# Patient Record
Sex: Female | Born: 1975 | Race: White | Hispanic: Yes | Marital: Married | Smoking: Never smoker
Health system: Southern US, Community
[De-identification: ages and names within clinical notes are randomized; demographics above are authoritative.]

## PROBLEM LIST (undated history)

## (undated) DIAGNOSIS — M1990 Unspecified osteoarthritis, unspecified site: Secondary | ICD-10-CM

## (undated) DIAGNOSIS — Z975 Presence of (intrauterine) contraceptive device: Secondary | ICD-10-CM

## (undated) DIAGNOSIS — O99810 Abnormal glucose complicating pregnancy: Secondary | ICD-10-CM

## (undated) DIAGNOSIS — I959 Hypotension, unspecified: Secondary | ICD-10-CM

## (undated) DIAGNOSIS — H269 Unspecified cataract: Secondary | ICD-10-CM

## (undated) HISTORY — DX: Presence of (intrauterine) contraceptive device: Z97.5

## (undated) HISTORY — DX: Unspecified cataract: H26.9

## (undated) HISTORY — DX: Abnormal glucose complicating pregnancy: O99.810

## (undated) HISTORY — DX: Unspecified osteoarthritis, unspecified site: M19.90

---

## 2002-09-16 ENCOUNTER — Encounter: Admission: RE | Admit: 2002-09-16 | Discharge: 2002-09-16 | Payer: Self-pay | Admitting: Family Medicine

## 2002-10-23 ENCOUNTER — Encounter: Admission: RE | Admit: 2002-10-23 | Discharge: 2002-10-23 | Payer: Self-pay | Admitting: Family Medicine

## 2003-01-21 ENCOUNTER — Encounter: Admission: RE | Admit: 2003-01-21 | Discharge: 2003-01-21 | Payer: Self-pay | Admitting: Family Medicine

## 2003-01-22 ENCOUNTER — Encounter: Payer: Self-pay | Admitting: Family Medicine

## 2003-01-22 ENCOUNTER — Ambulatory Visit (HOSPITAL_COMMUNITY): Admission: RE | Admit: 2003-01-22 | Discharge: 2003-01-22 | Payer: Self-pay | Admitting: Family Medicine

## 2003-01-28 ENCOUNTER — Encounter: Admission: RE | Admit: 2003-01-28 | Discharge: 2003-01-28 | Payer: Self-pay | Admitting: Family Medicine

## 2003-03-10 ENCOUNTER — Encounter: Admission: RE | Admit: 2003-03-10 | Discharge: 2003-03-10 | Payer: Self-pay | Admitting: Family Medicine

## 2003-03-26 ENCOUNTER — Encounter: Admission: RE | Admit: 2003-03-26 | Discharge: 2003-03-26 | Payer: Self-pay | Admitting: Sports Medicine

## 2003-04-16 ENCOUNTER — Inpatient Hospital Stay (HOSPITAL_COMMUNITY): Admission: AD | Admit: 2003-04-16 | Discharge: 2003-04-16 | Payer: Self-pay | Admitting: Obstetrics & Gynecology

## 2003-04-16 ENCOUNTER — Encounter: Admission: RE | Admit: 2003-04-16 | Discharge: 2003-04-16 | Payer: Self-pay | Admitting: Family Medicine

## 2003-04-20 ENCOUNTER — Encounter: Admission: RE | Admit: 2003-04-20 | Discharge: 2003-04-20 | Payer: Self-pay | Admitting: Family Medicine

## 2003-04-29 ENCOUNTER — Encounter: Admission: RE | Admit: 2003-04-29 | Discharge: 2003-04-29 | Payer: Self-pay | Admitting: Sports Medicine

## 2003-05-04 ENCOUNTER — Inpatient Hospital Stay (HOSPITAL_COMMUNITY): Admission: AD | Admit: 2003-05-04 | Discharge: 2003-05-05 | Payer: Self-pay | Admitting: Obstetrics & Gynecology

## 2003-05-15 ENCOUNTER — Inpatient Hospital Stay (HOSPITAL_COMMUNITY): Admission: AD | Admit: 2003-05-15 | Discharge: 2003-05-15 | Payer: Self-pay | Admitting: Family Medicine

## 2003-05-21 ENCOUNTER — Encounter: Admission: RE | Admit: 2003-05-21 | Discharge: 2003-05-21 | Payer: Self-pay | Admitting: Family Medicine

## 2003-07-16 ENCOUNTER — Encounter: Admission: RE | Admit: 2003-07-16 | Discharge: 2003-07-16 | Payer: Self-pay | Admitting: Sports Medicine

## 2003-07-20 ENCOUNTER — Encounter: Admission: RE | Admit: 2003-07-20 | Discharge: 2003-07-20 | Payer: Self-pay | Admitting: Family Medicine

## 2003-08-03 ENCOUNTER — Encounter: Admission: RE | Admit: 2003-08-03 | Discharge: 2003-08-03 | Payer: Self-pay | Admitting: Family Medicine

## 2003-08-06 ENCOUNTER — Ambulatory Visit (HOSPITAL_COMMUNITY): Admission: RE | Admit: 2003-08-06 | Discharge: 2003-08-06 | Payer: Self-pay | Admitting: Obstetrics & Gynecology

## 2003-09-16 ENCOUNTER — Encounter: Admission: RE | Admit: 2003-09-16 | Discharge: 2003-09-16 | Payer: Self-pay | Admitting: Sports Medicine

## 2003-12-15 ENCOUNTER — Encounter: Admission: RE | Admit: 2003-12-15 | Discharge: 2003-12-15 | Payer: Self-pay | Admitting: Family Medicine

## 2003-12-17 ENCOUNTER — Encounter: Admission: RE | Admit: 2003-12-17 | Discharge: 2003-12-17 | Payer: Self-pay | Admitting: Family Medicine

## 2003-12-29 ENCOUNTER — Encounter: Admission: RE | Admit: 2003-12-29 | Discharge: 2003-12-29 | Payer: Self-pay | Admitting: Family Medicine

## 2004-01-11 ENCOUNTER — Encounter: Admission: RE | Admit: 2004-01-11 | Discharge: 2004-03-21 | Payer: Self-pay | Admitting: Family Medicine

## 2004-01-26 ENCOUNTER — Encounter: Admission: RE | Admit: 2004-01-26 | Discharge: 2004-01-26 | Payer: Self-pay | Admitting: Sports Medicine

## 2004-03-28 ENCOUNTER — Encounter: Admission: RE | Admit: 2004-03-28 | Discharge: 2004-06-26 | Payer: Self-pay | Admitting: Family Medicine

## 2004-06-24 ENCOUNTER — Emergency Department (HOSPITAL_COMMUNITY): Admission: EM | Admit: 2004-06-24 | Discharge: 2004-06-24 | Payer: Self-pay | Admitting: Emergency Medicine

## 2004-06-30 ENCOUNTER — Ambulatory Visit: Payer: Self-pay | Admitting: Sports Medicine

## 2005-02-21 ENCOUNTER — Encounter (INDEPENDENT_AMBULATORY_CARE_PROVIDER_SITE_OTHER): Payer: Self-pay | Admitting: Family Medicine

## 2005-02-21 ENCOUNTER — Ambulatory Visit: Payer: Self-pay | Admitting: Family Medicine

## 2005-02-21 ENCOUNTER — Encounter (INDEPENDENT_AMBULATORY_CARE_PROVIDER_SITE_OTHER): Payer: Self-pay | Admitting: *Deleted

## 2005-02-21 LAB — CONVERTED CEMR LAB

## 2005-07-18 ENCOUNTER — Ambulatory Visit: Payer: Self-pay | Admitting: Family Medicine

## 2006-05-13 ENCOUNTER — Ambulatory Visit: Payer: Self-pay

## 2006-08-16 ENCOUNTER — Encounter (INDEPENDENT_AMBULATORY_CARE_PROVIDER_SITE_OTHER): Payer: Self-pay | Admitting: *Deleted

## 2006-12-09 ENCOUNTER — Ambulatory Visit: Payer: Self-pay | Admitting: Family Medicine

## 2006-12-09 DIAGNOSIS — M79609 Pain in unspecified limb: Secondary | ICD-10-CM

## 2006-12-09 DIAGNOSIS — M542 Cervicalgia: Secondary | ICD-10-CM | POA: Insufficient documentation

## 2007-07-29 ENCOUNTER — Ambulatory Visit: Payer: Self-pay | Admitting: Family Medicine

## 2007-08-28 ENCOUNTER — Encounter: Payer: Self-pay | Admitting: *Deleted

## 2007-09-22 ENCOUNTER — Encounter (INDEPENDENT_AMBULATORY_CARE_PROVIDER_SITE_OTHER): Payer: Self-pay | Admitting: Family Medicine

## 2007-09-22 ENCOUNTER — Ambulatory Visit: Payer: Self-pay | Admitting: Family Medicine

## 2007-09-24 LAB — CONVERTED CEMR LAB: Pap Smear: NORMAL

## 2007-09-25 ENCOUNTER — Encounter: Admission: RE | Admit: 2007-09-25 | Discharge: 2007-09-25 | Payer: Self-pay | Admitting: Family Medicine

## 2008-06-22 ENCOUNTER — Ambulatory Visit: Payer: Self-pay | Admitting: Family Medicine

## 2008-06-22 ENCOUNTER — Encounter: Payer: Self-pay | Admitting: Family Medicine

## 2008-06-22 ENCOUNTER — Emergency Department (HOSPITAL_COMMUNITY): Admission: EM | Admit: 2008-06-22 | Discharge: 2008-06-22 | Payer: Self-pay | Admitting: Emergency Medicine

## 2008-06-22 LAB — CONVERTED CEMR LAB
Beta hcg, urine, semiquantitative: NEGATIVE
Bilirubin Urine: NEGATIVE
Chlamydia, DNA Probe: NEGATIVE
GC Probe Amp, Genital: NEGATIVE
Glucose, Urine, Semiquant: NEGATIVE
Ketones, urine, test strip: NEGATIVE
Nitrite: NEGATIVE
Protein, U semiquant: NEGATIVE
Specific Gravity, Urine: 1.01
Urobilinogen, UA: 0.2
WBC Urine, dipstick: NEGATIVE
Whiff Test: NEGATIVE
pH: 5.5

## 2008-11-02 ENCOUNTER — Ambulatory Visit: Payer: Self-pay | Admitting: Family Medicine

## 2009-05-02 ENCOUNTER — Ambulatory Visit: Payer: Self-pay | Admitting: Family Medicine

## 2009-07-27 ENCOUNTER — Ambulatory Visit: Payer: Self-pay | Admitting: Family Medicine

## 2009-09-14 ENCOUNTER — Ambulatory Visit: Payer: Self-pay | Admitting: Family Medicine

## 2009-09-14 ENCOUNTER — Encounter: Payer: Self-pay | Admitting: Family Medicine

## 2009-09-14 LAB — CONVERTED CEMR LAB
Beta hcg, urine, semiquantitative: POSITIVE
Bilirubin Urine: NEGATIVE
Chlamydia, DNA Probe: NEGATIVE
GC Probe Amp, Genital: NEGATIVE
Glucose, Urine, Semiquant: NEGATIVE
Ketones, urine, test strip: NEGATIVE
Nitrite: NEGATIVE
Pap Smear: NEGATIVE
Protein, U semiquant: NEGATIVE
Specific Gravity, Urine: 1.015
Urobilinogen, UA: 1
WBC Urine, dipstick: NEGATIVE
Whiff Test: NEGATIVE
pH: 7.5

## 2009-09-29 ENCOUNTER — Ambulatory Visit: Payer: Self-pay | Admitting: Family Medicine

## 2009-09-29 ENCOUNTER — Encounter: Payer: Self-pay | Admitting: Family Medicine

## 2009-09-29 LAB — CONVERTED CEMR LAB
Antibody Screen: NEGATIVE
Basophils Absolute: 0 10*3/uL (ref 0.0–0.1)
Basophils Relative: 0 % (ref 0–1)
Eosinophils Absolute: 0 10*3/uL (ref 0.0–0.7)
Eosinophils Relative: 1 % (ref 0–5)
HCT: 39 % (ref 36.0–46.0)
Hemoglobin: 12.9 g/dL (ref 12.0–15.0)
Hepatitis B Surface Ag: NEGATIVE
Lymphocytes Relative: 29 % (ref 12–46)
Lymphs Abs: 1.9 10*3/uL (ref 0.7–4.0)
MCHC: 33.1 g/dL (ref 30.0–36.0)
MCV: 84.6 fL (ref 78.0–100.0)
Monocytes Absolute: 0.4 10*3/uL (ref 0.1–1.0)
Monocytes Relative: 6 % (ref 3–12)
Neutro Abs: 4.2 10*3/uL (ref 1.7–7.7)
Neutrophils Relative %: 64 % (ref 43–77)
Platelets: 279 10*3/uL (ref 150–400)
RBC: 4.61 M/uL (ref 3.87–5.11)
RDW: 13 % (ref 11.5–15.5)
Rh Type: POSITIVE
Rubella: 136.7 intl units/mL — ABNORMAL HIGH
Sickle Cell Screen: NEGATIVE
WBC: 6.5 10*3/uL (ref 4.0–10.5)

## 2009-09-30 ENCOUNTER — Encounter: Payer: Self-pay | Admitting: Family Medicine

## 2009-10-24 ENCOUNTER — Ambulatory Visit: Payer: Self-pay | Admitting: Family Medicine

## 2009-10-24 ENCOUNTER — Encounter: Payer: Self-pay | Admitting: Family Medicine

## 2009-10-24 LAB — CONVERTED CEMR LAB

## 2009-10-25 ENCOUNTER — Encounter: Payer: Self-pay | Admitting: Family Medicine

## 2009-10-25 ENCOUNTER — Ambulatory Visit (HOSPITAL_COMMUNITY): Admission: RE | Admit: 2009-10-25 | Discharge: 2009-10-25 | Payer: Self-pay | Admitting: Family Medicine

## 2009-10-26 ENCOUNTER — Encounter: Payer: Self-pay | Admitting: Family Medicine

## 2009-10-26 ENCOUNTER — Ambulatory Visit: Payer: Self-pay | Admitting: Family Medicine

## 2009-10-26 DIAGNOSIS — O99810 Abnormal glucose complicating pregnancy: Secondary | ICD-10-CM | POA: Insufficient documentation

## 2009-10-26 HISTORY — DX: Abnormal glucose complicating pregnancy: O99.810

## 2009-11-21 ENCOUNTER — Ambulatory Visit: Payer: Self-pay | Admitting: Family Medicine

## 2009-11-23 ENCOUNTER — Encounter: Payer: Self-pay | Admitting: Family Medicine

## 2009-12-12 ENCOUNTER — Encounter: Payer: Self-pay | Admitting: Family Medicine

## 2009-12-12 ENCOUNTER — Ambulatory Visit (HOSPITAL_COMMUNITY): Admission: RE | Admit: 2009-12-12 | Discharge: 2009-12-12 | Payer: Self-pay | Admitting: Family Medicine

## 2009-12-16 ENCOUNTER — Ambulatory Visit: Payer: Self-pay | Admitting: Family Medicine

## 2010-01-13 ENCOUNTER — Ambulatory Visit: Payer: Self-pay | Admitting: Family Medicine

## 2010-02-10 ENCOUNTER — Ambulatory Visit: Payer: Self-pay | Admitting: Family Medicine

## 2010-02-15 ENCOUNTER — Ambulatory Visit: Payer: Self-pay | Admitting: Family Medicine

## 2010-02-15 ENCOUNTER — Encounter: Payer: Self-pay | Admitting: Family Medicine

## 2010-02-17 ENCOUNTER — Ambulatory Visit (HOSPITAL_COMMUNITY): Admission: RE | Admit: 2010-02-17 | Discharge: 2010-02-17 | Payer: Self-pay | Admitting: Family Medicine

## 2010-02-17 ENCOUNTER — Encounter: Payer: Self-pay | Admitting: Family Medicine

## 2010-02-23 ENCOUNTER — Ambulatory Visit: Payer: Self-pay | Admitting: Family Medicine

## 2010-02-23 LAB — CONVERTED CEMR LAB
HCT: 36.2 % (ref 36.0–46.0)
Hemoglobin: 11.9 g/dL — ABNORMAL LOW (ref 12.0–15.0)
MCHC: 32.9 g/dL (ref 30.0–36.0)
MCV: 88.1 fL (ref 78.0–100.0)
Platelets: 242 10*3/uL (ref 150–400)
RBC: 4.11 M/uL (ref 3.87–5.11)
RDW: 13.2 % (ref 11.5–15.5)
WBC: 8.7 10*3/uL (ref 4.0–10.5)

## 2010-03-09 ENCOUNTER — Ambulatory Visit: Payer: Self-pay | Admitting: Family Medicine

## 2010-03-24 ENCOUNTER — Ambulatory Visit: Payer: Self-pay | Admitting: Family Medicine

## 2010-04-11 ENCOUNTER — Ambulatory Visit: Payer: Self-pay | Admitting: Family Medicine

## 2010-04-20 ENCOUNTER — Encounter: Payer: Self-pay | Admitting: Family Medicine

## 2010-04-20 ENCOUNTER — Ambulatory Visit: Payer: Self-pay | Admitting: Family Medicine

## 2010-04-24 ENCOUNTER — Ambulatory Visit: Payer: Self-pay | Admitting: Family Medicine

## 2010-05-03 ENCOUNTER — Ambulatory Visit: Payer: Self-pay | Admitting: Family Medicine

## 2010-05-04 ENCOUNTER — Encounter: Payer: Self-pay | Admitting: *Deleted

## 2010-05-08 ENCOUNTER — Inpatient Hospital Stay (HOSPITAL_COMMUNITY)
Admission: AD | Admit: 2010-05-08 | Discharge: 2010-05-08 | Payer: Self-pay | Source: Home / Self Care | Admitting: Obstetrics & Gynecology

## 2010-05-08 ENCOUNTER — Inpatient Hospital Stay (HOSPITAL_COMMUNITY)
Admission: AD | Admit: 2010-05-08 | Discharge: 2010-05-10 | Payer: Self-pay | Source: Home / Self Care | Admitting: Obstetrics & Gynecology

## 2010-05-08 ENCOUNTER — Ambulatory Visit: Payer: Self-pay | Admitting: Family Medicine

## 2010-06-23 ENCOUNTER — Ambulatory Visit: Admit: 2010-06-23 | Payer: Self-pay

## 2010-07-06 ENCOUNTER — Ambulatory Visit: Admission: RE | Admit: 2010-07-06 | Discharge: 2010-07-06 | Payer: Self-pay | Source: Home / Self Care

## 2010-07-06 DIAGNOSIS — R519 Headache, unspecified: Secondary | ICD-10-CM | POA: Insufficient documentation

## 2010-07-06 DIAGNOSIS — R51 Headache: Secondary | ICD-10-CM | POA: Insufficient documentation

## 2010-07-06 DIAGNOSIS — K219 Gastro-esophageal reflux disease without esophagitis: Secondary | ICD-10-CM | POA: Insufficient documentation

## 2010-07-18 NOTE — Assessment & Plan Note (Signed)
Summary: OB/MJ   Vital Signs:  Patient profile:   35 year old female Weight:      160.5 pounds BP sitting:   102 / 65  Vitals Entered By: Arlyss Repress CMA, (April 20, 2010 9:15 AM)  Primary Care [place]:  Angelena Sole MD   History of Present Illness: Visit in OB clinic, conducted in Spanish.  At 36 6/7 weeks by early [name].  Chart reviewed.    Patient's only complaint is hemorrhoidal pain that has increased over the past 1-2 weeks.  Denies bleeding with defecation.  Previously used Anusol HC cream with good results.  Increased fluid intake.   Habits & Providers  Alcohol-Tobacco-Diet     Cigarette Packs/Day: n/a  Current Medications (verified): 1)  Pnv-Dha 27-0.6-0.4-300 Mg Caps (Prenat W/o A-Fe-Methfol-Fa-Dha) .Marland Kitchen.. 1 Tab By Mouth Daily 2)  Anusol-Hc 2.5 % Crea (Hydrocortisone) .... Sig: Apply Twice Daily To Perianal Area. Disp 1 Large Tube Spanish Language Instructions  Allergies (verified): No Known Drug Allergies  Physical Exam  General:  Well appearing, no apparent distress.  Abdomen:  Gravid, fetal lie VTX L sided. Rectal:  small nonthrombosed hemorroid seen at 6 o'clock.  Small shallow fissure at 12 o'clock.  No internal hemorroids noted in DRE. Good rectal tone.  Genitalia:  Normal vaginal mucosa.  GBS swab collected.  Extremities:  trace ankle edema to palpation.   Impression & Recommendations:  Problem # 1:  PREGNANCY, MULTIGRAVIDA (ICD-V22.1) Patient seen in OB clinic, visit conducted in [place].  Feeling good fetal movement.  No visual changes or headaches.  Complains of perianal discomfort that is worse when she has a bowel movement.  Similar to previous hemorrhoids prior to pregnancy.  No blood in stool.  Increased fluid intake.  At 36 weeks 6 days today by [name] at 10/25/2009; prior low-lying placenta resolved on subsequent [name].   Plans to breastfeed, to bring her infant to [facility] for routine care.  Orders: [facility]p B Probe-[facility] ([id number]) Other OB visit- [facility]  (OBCK)  Problem # 2:  HEMORRHOIDS, EXTERNAL (ICD-455.3) Ext hemorrhoid and small midline fissure.  Not thrombosed.  Discussed increased fluid intake; use of topical cream.  Printed Rx given to patient.  Complete Medication List: 1)  Pnv-dha 27-0.6-0.4-300 Mg Caps (Prenat w/o a-fe-methfol-fa-dha) .Marland Kitchen.. 1 tab by mouth daily 2)  Anusol-hc 2.5 % Crea (Hydrocortisone) .... Sig: apply twice daily to perianal area. disp 1 large tube spanish language instructions  Patient Instructions: 1)  Fue un placer verle hoy en la clinica de obstetricia.  Tiene 36 semanas con 6 dias; el crecimiento del bebe esta' muy apropiado. 2)  Hoy le hicimos un cultivo para una bacteria que puede representar un peligro para el bebe (un tipo de Warden/ranger).  Si esta' presente, se puede tratar en la hora del parto con una clase de penicilina.  3)  Cada manana y noche enfoquese en los movimientos del bebe.  Si siente por lo  menos cinco movimientos en una hora, no tiene que seguir contando.  Si no llega a cinco movimientos en una hora, siga contando por otra hora.  Si no llega a diez movimientos en estas dos horas, debe presentarse al [employer] de Emergencia de [facility] o llamar a Event organiser. 4)  Por favor marque una cita en una semana con el Dr Gwendolyn Grant.  5)  OB FOLLOWUP WITH DR Gwendolyn Grant IN 1 WEEK. Prescriptions: ANUSOL-HC 2.5 % CREA (HYDROCORTISONE) SIG: Apply twice daily to perianal area. DISP 1 large tube SPanish language instructions  #  1 x 1   Entered and Authorized by:   Paula Compton MD   Signed by:   Paula Compton MD on 04/20/2010   Method used:   Print then Give to Patient   RxID:   [id number]    Orders Added: 1)  Grp B Probe-[facility] [[id number]] 2)  Other OB visit- [facility] [OBCK]     OB Initial Intake Information    Positive HCG by: self    Race: Other    Marital status: Married  FOB Information    Husband/Father of baby: Husband    FOB occupation [employer]  Menstrual History    LMP (date):  07/14/2009    LMP - Character: normal    Menarche: 15 years    Menses interval: 28 days    Menstrual flow 5 days    On BCP's at conception: no   Flowsheet View for Follow-up Visit    Estimated weeks of       gestation:     36 6/7    Weight:     160.5    Blood pressure:   102 / 65    Headache:     No    Nausea/vomiting:   No    Edema:     TrLE    Vaginal bleeding:   no    Vaginal discharge:   no    Fundal height:      35.5    FHR:       140    Fetal activity:     yes    Labor symptoms:   few ctx    Fetal position:     vertex    Taking prenatal vits?   Y    Smoking:     n/a    Next visit:     1 wk    Preceptor:     Mauricio Po    Comment:     GBS done today.    Flowsheet View for Follow-up Visit    Estimated weeks of       gestation:     36 6/7    Weight:     160.5    Blood pressure:   102 / 65    Hx headache?     No    Nausea/vomiting?   No    Edema?     TrLE    Bleeding?     no    Leakage/discharge?   no    Fetal activity:       yes    Labor symptoms?   few ctx    Fundal height:      35.5    FHR:       140    Fetal position:      vertex    Taking Vitamins?   Y    Smoking PPD:   n/a    Comment:     GBS done today.     Next visit:     1 wk    Preceptor:     Mauricio Po

## 2010-07-18 NOTE — Assessment & Plan Note (Signed)
Summary: ob visit/eo   Vital Signs:  Patient profile:   35 year old female Weight:      158.9 pounds Temp:     98.7 degrees F oral Pulse rate:   88 / minute Pulse rhythm:   regular BP sitting:   98 / 60  (left arm) Cuff size:   regular  Vitals Entered By: Loralee Pacas CMA (April 11, 2010 8:34 AM)  Primary Care Provider:  Angelena Sole MD   History of Present Illness: 35 yo G3P2002 who presents today for follow-up routine OB visit.  No problems thus far with pregnancy.  She is switching OB providers.    Only complaint is some back pain she's had for past week or so, mild, lumbar region, relieved with occasional Tylenol use.   Habits & Providers  Alcohol-Tobacco-Diet     Cigarette Packs/Day: n/a  Current Problems (verified): 1)  Abnormal Maternal Glucose Tolerance Antepartum  (MWN-027.25) 2)  Pregnancy, Multigravida  (ICD-V22.1) 3)  Family History of Malignant Neoplasm Ovary  (ICD-V16.41) 4)  Screening For Malignant Neoplasm of The Cervix  (ICD-V76.2) 5)  Routine Gynecological Examination  (ICD-V72.31) 6)  Leg Pain, Right  (ICD-729.5) 7)  Neck Pain  (ICD-723.1)  Current Medications (verified): 1)  Pnv-Dha 27-0.6-0.4-300 Mg Caps (Prenat W/o A-Fe-Methfol-Fa-Dha) .Marland Kitchen.. 1 Tab By Mouth Daily  Allergies (verified): No Known Drug Allergies  Past History:  Past medical, surgical, family and social histories (including risk factors) reviewed, and no changes noted (except as noted below).  Past Medical History: Reviewed history from 12/16/2009 and no changes required. 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 stenting due to nephrolithiasis? - 06/18/2000    Family History: Reviewed history from 07/27/2009 and no changes required. ? Stomach cancer, + DM, mother & MGM, F-died from accident, M-died at age 71, ovarian CA, No Breast Cancer, No CAD, No Colon  Cancer, No HTN    Social History: Reviewed history from 02/10/2010 and no changes required. living with husbanb, daughter and son in [place]; originally from [place], [place]; no tobacco/ETOH/ellicit drugs; was working in [employer]; however, is no longer working there until she delivers  Review of Systems       no headaches, vision changes, chest pain, dyspnea, nausea/vomiting, changes in bowel habits, lower extremity edema   Physical Exam  General:  Vital signs reviewed. Well-developed, well-nourished patient in NAD.  Awake and cooperative  Mouth:  oral dentition fair Lungs:  clear to auscultation bilaterally without wheezing, rales, or rhonchi.  Normal work of breathing  Heart:  Regular rate and rhythm without murmur, rub, or gallop.  Normal S1/S2  Abdomen:  gravid, fundal height 34 cm.  FHTs appropriate for gestational age, bowel sounds present in all four quadrants    Impression & Recommendations:  Problem # 1:  PREGNANCY, MULTIGRAVIDA (ICD-V22.1) Assessment Unchanged  34 yo [id number] at 35.4 weeks via 1st trimester [name].   EDC: 05/12/10 4/14 Prenatal labs: Hgb 12.9/Hct 39, Platelets 279, Hep B Neg, Rubella Imm, RPR NR, O+/Ab neg, [place] neg, HIV NR 4/14 Urine culture:  Insignificant growth 5/9 1 hour Glucola: 148 5/9 Varicella titer IgG Negative 5/10 [name]:  Single IUP gestational age 63.4 weeks, complete previa 5/11 3 hour Glucola: [phone] 8/31 28 week 3 hour: [phone] 9/2 [name]: previa resolved.   9/8: 28 week labs: Hgb 11.9/Hct 36.2, HIV NR, RPR NR  My first time meeting the patient today.  No problems or complaints with pregnancy thus far.  Fundal heigh 34 cm, has been 1 cm smaller than expected last measurement.  Otherwise FHT's have been WNL.  Will schedule for OB Clinic next available appt.  She is to follow up with me in 1 week, will obtain GBS at that visit.    Orders: Other OB visit- [facility] (OBCK)  Complete Medication List: 1)  Pnv-dha  27-0.6-0.4-300 Mg Caps (Prenat w/o a-fe-methfol-fa-dha) .Marland Kitchen.. 1 tab by mouth daily  Patient Instructions: 1)  You are doing great. 2)  It was great to meet you today! 3)  Make an appt to see me next week, and for the next OB visit.  4)  If you notice any decreased movement, vaginal bleeding, or discharge please go to the [facility]   Orders Added: 1)  Other OB visit- [facility] [OBCK]      Flowsheet View for Follow-up Visit    Estimated weeks of       gestation:     35 4/7    Weight:     158.9    Blood pressure:   98 / 60    Headache:     No    Nausea/vomiting:   No    Edema:     0    Vaginal bleeding:   no    Vaginal discharge:   no    Fundal height:      34    FHR:       130    Fetal activity:     yes    Taking prenatal vits?   Y    Smoking:     n/a    Next visit:     1 wk    Resident:     Buren Kos for Follow-up Visit    Estimated weeks of       gestation:     35 4/7    Weight:     158.9    Blood pressure:   98 / 60    Hx headache?     No    Nausea/vomiting?   No    Edema?     0    Bleeding?     no    Leakage/discharge?   no    Fetal activity:       yes    Fundal height:      34    FHR:       130    Taking Vitamins?   Y    Smoking PPD:   n/a    Next visit:     1 wk    Resident:     Lelon Perla   OB Initial Intake Information    Positive HCG by: self    Race: Other    Marital status: Married  FOB Information    Husband/Father of baby: Husband    FOB occupation [employer]  Menstrual History    LMP (date): 07/14/2009    LMP - Character: normal    Menarche: 15 years    Menses interval: 28 days    Menstrual flow 5 days    On BCP's at conception: no   Past Pregnancy History  Pregnancy # 1    Comments:     Outcome: Miscarriage.

## 2010-07-18 NOTE — Assessment & Plan Note (Signed)
Summary: new ob/aam/kh   Vital Signs:  Patient profile:   35 year old female LMP:     07/14/2009 Height:      62 inches Weight:      136.9 pounds BMI:     25.13 Pulse rate:   70 / minute BP sitting:   93 / 56  (right arm) Cuff size:   regular  Vitals Entered By: Garen Grams LPN (Oct 25, 979 8:51 AM) CC: NOB Is Patient Diabetic? No Pain Assessment Patient in pain? no      LMP (date): 07/14/2009 EDC by LMP==> 04/20/2010 LMP - Character: normal LMP - Reliable? Yes Menarche (age onset years): 15   Menses interval (days): 28 Menstrual flow (days): 5 On BCP's at conception: no Enter LMP: 07/14/2009 Last PAP Result NEGATIVE FOR INTRAEPITHELIAL LESIONS OR MALIGNANCY.   OB Initial Intake Information    Positive HCG by: self    Race: Other    Marital status: Married  FOB Information    Husband/Father of baby: Husband    FOB occupation [employer]  Menstrual History    LMP (date): 07/14/2009    EDC by LMP: 04/20/2010    LMP - Character: normal    LMP - Reliable? : Yes    Menarche: 15 years    Menses interval: 28 days    Menstrual flow 5 days    On BCP's at conception: no    Pre Pregnancy Weight: 135 lbs.    Symptoms since LMP: nausea, fatigue    Other symptoms: headache   CC:  NOB.  Habits & Providers  Alcohol-Tobacco-Diet     Tobacco Status: never     Cigarette Packs/Day: n/a  Current Medications (verified): 1)  Ranitidine Hcl 300 Mg Tabs (Ranitidine Hcl) .Marland Kitchen.. 1 Tablet  A Day For Stomach 2)  Triple Paste 12.8 % Oint (Zinc Oxide) .... Apply Small Amount Three Times A Day Qs: For 1 Month 3)  Anusol-Hc 2.5 % Crea (Hydrocortisone) .... 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  Will get ultrasound for dates.  Will also check Varicella titer since she is non-immune.  Orders: Miscellaneous Lab Charge-[facility] [phone]) Glucose 1 hr-[facility] ([id number]) Ultrasound (Ultrasound) Other OB visit- [facility] (OBCK)  Complete Medication List: 1)  Ranitidine Hcl 300 Mg Tabs (Ranitidine hcl) .Marland Kitchen.. 1 tablet  a day for stomach 2)  Triple Paste 12.8 % Oint (Zinc oxide) .... Apply small amount three times a day qs: for 1 month 3)  Anusol-hc 2.5 % Crea (Hydrocortisone) .... Sig apply to anal area twice daily after bathing or sitz baths disp 1 tube instructions in spanish Prenatal Visit    FOB name: Husband EDC Confirmation:    LMP reliable? Yes    Last menses onset (LMP) date: 07/14/2009    EDC by LMP: 04/20/2010   Past Pregnancy History    Gravida:     4    Term Births:     2    Premature Births:   0    Living Children:   2    Para:  2    Mult. Births:     0    Prev C-Section:   0    Aborta:     1    Elect. Ab:     0    Spont. Ab:     1  Pregnancy # 1    Delivery date:     10/29/1996    Comments:     Miscarriage  Pregnancy # 2    Delivery date:     01/26/1999    Weeks Gestation:   38    Preterm labor:     no    Delivery type:     NSVD    Anesthesia type:     epidural    Delivery location:     [place]    Infant Sex:     Female    Birth weight:     7    Name:     Marily Memos  Pregnancy # 3    Delivery date:     05/04/2003    Weeks Gestation:   40    Preterm labor:     no    Delivery type:     NSVD    Anesthesia type:     IV pain meds    Delivery location:     [facility]    Infant Sex:     Female    Birth weight:     7#6    Name:     Porfirio Mylar   Genetic History     Thalassemia:     mother: no    Neural tube defect:   mother: no    Down's Syndrome:   mother: no    Tay-Sachs:     mother: no    Sickle Cell Dz/Trait:    mother: no    Hemophilia:     mother: no    Muscular Dystrophy:   mother: no    Cystic Fibrosis:   mother: no    Huntington's Dz:   mother: no    Mental Retardation:   mother: no    Fragile X:     mother: no    Other Genetic or       Chromosomal Dz:   mother: no    Child with other       birth defect:     mother: no    > 3 spont. abortions:   mother: no    Hx of stillbirth:     mother: no  Infection Risk History    Infection Risk History Reviewed:    High Risk Hepatitis B: no    Immunized against Hepatitis B: no    Exposure to TB: no    Patient with history of Genital Herpes: no    Sexual partner with history of Genital Herpes: no    History of STD (GC, Chlamydia, Syphilis, HPV): no    Rash, Viral, or Febrile Illness since LMP: no    Exposure to Cat Litter: no    Chicken Pox Immune Status: Non-immune    History of Parvovirus (Fifth Disease): no    Occupational Exposure to Children: none  Environmental Exposures    Environmental Exposures Reviewed:    Xray Exposure since LMP: no    Chemical or other exposure: no    Medication, drug, or alcohol use since LMP: no   Flowsheet View for Follow-up Visit    Weight:     136.9    Blood pressure:   93 /  56    Headache:     No    Nausea/vomiting:   nausea    Edema:     0    Vaginal bleeding:   no    Vaginal discharge:   white d/c    FHR:       160    Fetal activity:     yes    Labor symptoms:   no    Taking prenatal vits?   Y    Smoking:     n/a    Next visit:     4 wk    Resident:     Lelon Perla    Preceptor:     [place]

## 2010-07-18 NOTE — Assessment & Plan Note (Signed)
Summary: ob visit/eo   Vital Signs:  Patient profile:   35 year old female Height:      62 inches Weight:      161.2 pounds BMI:     29.59 Temp:     98.3 degrees F oral Pulse rate:   85 / minute BP sitting:   114 / 66  (right arm) Cuff size:   regular  Vitals Entered By: Jimmy Footman, CMA (May 08, 2010 2:18 PM) CC: ob visit 39    3/7 Is Patient Diabetic? No Pain Assessment Patient in pain? yes        Primary Care Tayvion Lauder:  Angelena Sole MD  CC:  ob visit 39    3/7.  History of Present Illness: 1.  Contractions:  Patient having persistent contractions every 15-20 minutes since 3 AM.  Describes as tightness across abdomen with back and pelvic pain.  No bleeding, rush of fluid, increased vaginal discharge.  See CPOE.    Habits & Providers  Alcohol-Tobacco-Diet     Tobacco Status: never  Current Problems (verified): 1)  Hemorrhoids, External  (ICD-455.3) 2)  Abnormal Maternal Glucose Tolerance Antepartum  (YNW-295.62) 3)  Pregnancy, Multigravida  (ICD-V22.1) 4)  Family History of Malignant Neoplasm Ovary  (ICD-V16.41) 5)  Screening For Malignant Neoplasm of The Cervix  (ICD-V76.2) 6)  Routine Gynecological Examination  (ICD-V72.31) 7)  Leg Pain, Right  (ICD-729.5) 8)  Neck Pain  (ICD-723.1)  Current Medications (verified): 1)  Pnv-Dha 27-0.6-0.4-300 Mg Caps (Prenat W/o A-Fe-Methfol-Fa-Dha) .Marland Kitchen.. 1 Tab By Mouth Daily 2)  Anusol-Hc 2.5 % Crea (Hydrocortisone) .... Sig: Apply Twice Daily To Perianal Area. Disp 1 Large Tube Spanish Language Instructions  Allergies (verified): No Known Drug Allergies  Review of Systems       no headaches, vision changes, chest pain, dyspnea, nausea/vomiting, changes in bowel habits, lower extremity edema   Physical Exam  General:  Somewhat anxious, breathing fast, patient in mild distress Abdomen:  gravid, fundal height and FHTs appropriate for gestational age, bowel sounds present in all four quadrants  Genitalia:  see  flowsheet   Impression & Recommendations:  Problem # 1:  PREGNANCY, MULTIGRAVIDA (ICD-V22.1) Concern that patient may be going into labor.  Cervical exam 3/40/-3.  Persistent contractions that are becoming stronger and more frequent, q 15-20 minutes since 3 AM today.  Patient obviously uncomfortable on exam and when I encountered her walking into room.   Gave verbal instructions to head straight to MAU, translator present for entire interview.  Patient expresses understanding.   Will call MAU staff and let them know she is coming.   Will probably need to observe over time for cervical change.  F Orders: Other OB visit- [facility] (OBCK)  Complete Medication List: 1)  Pnv-dha 27-0.6-0.4-300 Mg Caps (Prenat w/o a-fe-methfol-fa-dha) .Marland Kitchen.. 1 tab by mouth daily 2)  Anusol-hc 2.5 % Crea (Hydrocortisone) .... Sig: apply twice daily to perianal area. disp 1 large tube spanish language instructions   Orders Added: 1)  Other OB visit- [facility] [OBCK]     Flowsheet View for Follow-up Visit    Estimated weeks of       gestation:     39 3/7    Weight:     161.2    Blood pressure:   114 / 66    Hx headache?     No    Nausea/vomiting?   No    Edema?     TrLE    Bleeding?     no  Leakage/discharge?   no    Fetal activity:       yes    Labor symptoms?   yes    Fundal height:      38    FHR:       145    Cx dilation:     3    Cx effacement:   40%    Fetal station:     -3    Comment:     sent to MAU with concern for labor    Next visit:     1 wk    Resident:     Gwendolyn Grant

## 2010-07-18 NOTE — Assessment & Plan Note (Signed)
Summary: AAM/KH   Vital Signs:  Patient profile:   35 year old female Weight:      152.7 pounds Temp:     98.6 degrees F oral Pulse rate:   73 / minute Pulse rhythm:   regular BP sitting:   95 / 55  (left arm) Cuff size:   regular  Vitals Entered By: Alexandria Casey CMA (February 23, 2010 10:50 AM)   Primary Care Provider:  Angelena Sole MD   History of Present Illness: Visit conducted in Spanish.  Alexandria Casey is being seen today in OB clnic.   She voices no complaints or concerns.  Feeling daily vigorous baby movement.  No ctx. No discharge or bleeding.  Mild ankle swellling, no headaches or ctx.   Habits & Providers  Alcohol-Tobacco-Diet     Tobacco Status: never     Cigarette Packs/Day: n/a  Allergies: No Known Drug Allergies   Impression & Recommendations:  Problem # 1:  PREGNANCY, MULTIGRAVIDA (ICD-V22.1) Routine pregnancy care for this [id number], now at EGA 28 6/7 weeks.  Plans to breastfeed, and to bring infant to [facility] for routine care.  Continues taking PNVs.  Orders: CBC-[facility] ([id number]) HIV-[facility] ([id number]) RPR-[facility] ([id number]) Other OB visit- [facility] (OBCK)  Problem # 2:  PLACENTA PREVIA (ICD-641.00) Reivewed results of US done last week.  Placenta has migrated to more favorable site, no longer placenta previa.  Pt with plans for vaginal delivery.   Complete Medication List: 1)  Pnv-dha 27-0.6-0.4-300 Mg Caps (Prenat w/o a-fe-methfol-fa-dha) .Marland Kitchen.. 1 tab by mouth daily  Patient Instructions: 1)  Fue un placer verle hoy. Todo parece estar bien con [name] y con su bebe.  El ultrasonido demonstro' que la placenta se ha movido fuera de la entrada/salida del utero,que son buenas noticias.  2)  Cada manana y noche enfoquese en los movimientos del bebe.  Si siente por lo  menos cinco movimientos en una hora, no tiene que seguir contando.  Si no llega a cinco movimientos en una hora, siga contando por otra hora.  Si no llega a diez movimientos en estas dos horas, debe presentarse  al [employer] de [employer] de [facility] o llamar a Event organiser. 3)  Por favor marque otra cita con el Dr Alexandria Casey en 2 semanas  4)  FOLLOWUP OB WITH DR Alexandria Casey IN 2 WEEKS.    Flowsheet View for Follow-up Visit    Estimated weeks of       gestation:     28 6/7    Weight:     152.7    Blood pressure:   95 / 55    Edema:     0    Vaginal bleeding:   no    Vaginal discharge:   no    Fundal height:      29    FHR:       140    Fetal activity:     yes    Labor symptoms:   no    Taking prenatal vits?   Y    Smoking:     n/a    Next visit:     2 wk    Preceptor:     Alexandria Casey View for Follow-up Visit    Estimated weeks of       gestation:     28 6/7    Weight:     152.7    Blood pressure:   95 / 55    Edema?  0    Bleeding?     no    Leakage/discharge?   no    Fetal activity:       yes    Labor symptoms?   no    Fundal height:      29    FHR:       140    Taking Vitamins?   Y    Smoking PPD:   n/a    Next visit:     2 wk    Preceptor:     Alexandria Casey

## 2010-07-18 NOTE — Assessment & Plan Note (Signed)
Summary: ob visit/eo   Vital Signs:  Patient profile:   35 year old female Weight:      164 pounds Temp:     98.4 degrees F oral Pulse rate:   83 / minute Pulse rhythm:   regular BP sitting:   92 / 55  (left arm) Cuff size:   regular  Vitals Entered By: Loralee Pacas CMA (May 03, 2010 1:28 PM)  Habits & Providers  Alcohol-Tobacco-Diet     Cigarette Packs/Day: n/a  Current Problems (verified): 1)  Hemorrhoids, External  (ICD-455.3) 2)  Abnormal Maternal Glucose Tolerance Antepartum  (ZOX-096.04) 3)  Pregnancy, Multigravida  (ICD-V22.1) 4)  Family History of Malignant Neoplasm Ovary  (ICD-V16.41) 5)  Screening For Malignant Neoplasm of The Cervix  (ICD-V76.2) 6)  Routine Gynecological Examination  (ICD-V72.31) 7)  Leg Pain, Right  (ICD-729.5) 8)  Neck Pain  (ICD-723.1)  Current Medications (verified): 1)  Pnv-Dha 27-0.6-0.4-300 Mg Caps (Prenat W/o A-Fe-Methfol-Fa-Dha) .Marland Kitchen.. 1 Tab By Mouth Daily 2)  Anusol-Hc 2.5 % Crea (Hydrocortisone) .... Sig: Apply Twice Daily To Perianal Area. Disp 1 Large Tube Spanish Language Instructions  Allergies (verified): No Known Drug Allergies  Past History:  Past medical, surgical, family and social histories (including risk factors) reviewed, and no changes noted (except as noted below).  Past Medical History: Reviewed history from 12/16/2009 and no changes required. 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 stenting due to nephrolithiasis? - 06/18/2000    Family History: Reviewed history from 07/27/2009 and no changes required. ? Stomach cancer, + DM, mother & MGM, F-died from accident, M-died at age 84, ovarian CA, No Breast Cancer, No CAD, No Colon Cancer, No HTN    Social History: Reviewed history from 04/11/2010 and no changes required. living with husbanb, daughter and son in [place]; originally from  [place], [place]; no tobacco/ETOH/ellicit drugs; was working in [employer]; however, is no longer working there until she delivers  Physical Exam  General:  Vital signs reviewed. Well-developed, well-nourished patient in NAD.  Awake and cooperative  Abdomen:  gravid, fundal height and FHTs appropriate for gestational age, bowel sounds present in all four quadrants  Psych:  not depressed or anxios appearing    Impression & Recommendations:  Problem # 1:  PREGNANCY, MULTIGRAVIDA (ICD-V22.1)  35 yo [id number] at 35.4 weeks via 1st trimester [name].   EDC: 05/12/10 4/14 Prenatal labs: Hgb 12.9/Hct 39, Platelets 279, Hep B Neg, Rubella Imm, RPR NR, O+/Ab neg, [place] neg, HIV NR 4/14 Urine culture:  Insignificant growth 5/9 1 hour Glucola: 148, Varicella titer IgG Negative 5/10 [name]:  Single IUP gestational age 11.4 weeks, complete previa 5/11 3 hour Glucola: [phone] 8/31 28 week 3 hour: [phone] 9/2 [name]: previa resolved.   9/8: 28 week labs: Hgb 11.9/Hct 36.2, HIV NR, RPR NR 11/5: GBS Negative Baby: Aliene Beams Wants Mirena IUD Breast feeding.    Discussed birth plan with patient.  Fundal heights good, FHTs WNL.  FU in 1 week.   Orders: Other [facility] visit- [facility] ([facility])  Complete Medication List: 1)  Pnv-dha 27-0.6-0.4-300 Mg Caps (Prenat w/o a-fe-methfol-fa-dha) .Marland Kitchen.. 1 tab by mouth daily 2)  Anusol-hc 2.5 % Crea (Hydrocortisone) .... Sig: apply twice daily to perianal area. disp 1 large tube spanish language instructions  Patient Instructions: 1)  Cada manana y noche enfoquese en los movimientos del bebe.  Si  siente por lo  menos cinco movimientos en una hora, no tiene que seguir contando.  Si no llega a cinco movimientos en una hora, siga contando por otra hora.  Si no llega a diez movimientos en estas dos horas, debe presentarse al [employer] de [employer] de [facility] o llamar a Event organiser. 2)  Por favor marque una cita en una semana con el Dr Gwendolyn Grant.    3)  OB FOLLOWUP WITH DR Gwendolyn Grant IN 1 WEEK.   Orders Added: 1)  Other OB visit- [facility] [OBCK]      Flowsheet View for Follow-up Visit    Estimated weeks of       gestation:     38 5/7    Weight:     164    Blood pressure:   92 / 55    Headache:     No    Nausea/vomiting:   No    Edema:     TrLE    Vaginal bleeding:   no    Vaginal discharge:   no    Fetal activity:     yes    Labor symptoms:   few ctx    Taking prenatal vits?   Y    Smoking:     n/a    Next visit:     1 wk    Resident:     Mendel [place] for Follow-up Visit    Estimated weeks of       gestation:     38 5/7    Weight:     164    Blood pressure:   92 / 55    Hx headache?     No    Nausea/vomiting?   No    Edema?     TrLE    Bleeding?     no    Leakage/discharge?   no    Fetal activity:       yes    Labor symptoms?   few ctx    Taking Vitamins?   Y    Smoking PPD:   n/a    Next visit:     1 wk    Resident:     Gwendolyn Grant

## 2010-07-18 NOTE — Assessment & Plan Note (Signed)
Summary: ob visit/eo   Vital Signs:  Patient profile:   35 year old female Weight:      161.3 pounds Temp:     98.3 degrees F oral Pulse rate:   78 / minute Pulse rhythm:   regular BP sitting:   93 / 54  (left arm) Cuff size:   regular  Vitals Entered By: Loralee Pacas CMA (April 24, 2010 3:24 PM) CC: ob   CC:  ob.  Habits & Providers  Alcohol-Tobacco-Diet     Cigarette Packs/Day: n/a  Current Medications (verified): 1)  Pnv-Dha 27-0.6-0.4-300 Mg Caps (Prenat W/o A-Fe-Methfol-Fa-Dha) .Marland Kitchen.. 1 Tab By Mouth Daily 2)  Anusol-Hc 2.5 % Crea (Hydrocortisone) .... Sig: Apply Twice Daily To Perianal Area. Disp 1 Large Tube Spanish Language Instructions  Allergies (verified): No Known Drug Allergies  Review of Systems       no headaches, vision changes, chest pain, dyspnea, nausea/vomiting, changes in bowel habits, lower extremity edema   Physical Exam  General:  Vital signs reviewed. Well-developed, well-nourished patient in NAD.  Awake and cooperative  Abdomen:  gravid, fundal height and FHTs appropriate for gestational age, bowel sounds present in all four quadrants  Extremities:  no edema/clubbing/cyanosis/erythema Neurologic:  No focal deficits   Impression & Recommendations:  Problem # 1:  PREGNANCY, MULTIGRAVIDA (ICD-V22.1)  35 yo [id number] at 35.4 weeks via 1st trimester [name].   EDC: 05/12/10 4/14 Prenatal labs: Hgb 12.9/Hct 39, Platelets 279, Hep B Neg, Rubella Imm, RPR NR, O+/Ab neg, [place] neg, HIV NR 4/14 Urine culture:  Insignificant growth 5/9 1 hour Glucola: 148 5/9 Varicella titer IgG Negative 5/10 [name]:  Single IUP gestational age 69.4 weeks, complete previa 5/11 3 hour Glucola: 64-141-133-121 8/31 28 week 3 hour: [phone] 9/2 [name]: previa resolved.   9/8: 28 week labs: Hgb 11.9/Hct 36.2, HIV NR, RPR NR 11/5: GBS Negative  No problems or complaints today except for some mild back pain.  Few contractions, no more than 1 per hour at most.  To follow up  in 1 week.  Will fax info over to [facility].   No name yet.  Boy. Wants Mirena IUD Breast feeding.    Orders: Other OB visit- [facility] ([facility])  Complete Medication List: 1)  Pnv-dha 27-0.6-0.4-300 Mg Caps (Prenat w/o a-fe-methfol-fa-dha) .Marland Kitchen.. 1 tab by mouth daily 2)  Anusol-hc 2.5 % Crea (Hydrocortisone) .... Sig: apply twice daily to perianal area. disp 1 large tube spanish language instructions  Patient Instructions: 1)  Fue un placer verle hoy en la clinica de obstetricia.  Tiene 37 semanas con 3 dias; el crecimiento del bebe esta' muy apropiado. 2)  Hoy le hicimos un cultivo para una bacteria que puede representar un peligro para el bebe (un tipo de Warden/ranger). Esta negativa. 3)  Cada manana y noche enfoquese en los movimientos del bebe.  Si siente por lo  menos cinco movimientos en una hora, no tiene que seguir contando.  Si no llega a cinco movimientos en una hora, siga contando por otra hora.  Si no llega a diez movimientos en estas dos horas, debe presentarse al [employer] de Emer[facility]cia de [facility] o llamar a Event organiser. 4)  Por favor marque una cita en una semana con el Dr Gwendolyn Grant.  5)  OB FOLLOWUP WITH DR Gwendolyn Grant IN 1 WEEK.   Orders Added: 1)  Other OB visit- [facility] [OBCK]      Flowsheet View for Follow-up Visit    Estimated weeks of  gestation:     37 3/7    Weight:     161.3    Blood pressure:   93 / 54    Headache:     No    Nausea/vomiting:   No    Edema:     TrLE    Vaginal bleeding:   no    Vaginal discharge:   no    Fundal height:      36    FHR:       128    Fetal activity:     yes    Labor symptoms:   few ctx    Taking prenatal vits?   Y    Smoking:     n/a    Next visit:     1 wk    Resident:     Gwendolyn Grant    Comment:     Decided on IUD today    Flowsheet View for Follow-up Visit    Estimated weeks of       gestation:     37 3/7    Weight:     161.3    Blood pressure:   93 / 54    Hx headache?     No    Nausea/vomiting?   No     Edema?     TrLE    Bleeding?     no    Leakage/discharge?   no    Fetal activity:       yes    Labor symptoms?   few ctx    Fundal height:      36    FHR:       128    Taking Vitamins?   Y    Smoking PPD:   n/a    Comment:     Decided on IUD today    Next visit:     1 wk    Resident:     Gwendolyn Grant

## 2010-07-18 NOTE — Letter (Signed)
Summary: Handout Printed  Printed Handout:  - Prenatal-Flowsheet-CCC 

## 2010-07-18 NOTE — Assessment & Plan Note (Signed)
Summary: preg?/[place]/saunders   Vital Signs:  Patient profile:   35 year old female Weight:      137 pounds Temp:     99.1 degrees F oral Pulse rate:   81 / minute Pulse rhythm:   regular BP sitting:   94 / 57  (left arm) Cuff size:   regular  Vitals Entered By: Loralee Pacas CMA (September 14, 2009 1:37 PM)  Primary Care Provider:  Angelena Sole MD  CC:  Pregnant? and Right breast tenderness.  History of Present Illness: 1. ? Pregnancy:  Pt is 3-4 days late from her period.  Her LMP was 02/26 or 02/27.  She is pretty sure of those dates.  She has had some vaginal discharge.  She is sexually active and does not use contraception.  2. Right breast tenderness:  Over the past 15 days her right breast has been a little tender.  She thinks that she may have felt a small bump in the lateral aspect of her right breast.  It gets a little bigger and more tender when she is on her period.   Denies any nipple discharge, skin dimpling.  No masses in the axilla or other breast.  3. Vaginal discharge:  Is having a thick white discharge.  She is also having a little bit of dysuria.  Denies itching or burning.      ROS: endorses some mild abdominal / back pain  Current Medications (verified): 1)  Ranitidine Hcl 300 Mg Tabs (Ranitidine Hcl) .Marland Kitchen.. 1 Tablet  A Day For Stomach 2)  Triple Paste 12.8 % Oint (Zinc Oxide) .... Apply Small Amount Three Times A Day Qs: For 1 Month 3)  Anusol-Hc 2.5 % Crea (Hydrocortisone) .... Sig Apply To Anal Area Twice Daily After Bathing or Sitz Baths Disp 1 Tube Instructions in Spanish  Allergies: No Known Drug Allergies  Past History:  Past Medical History: Reviewed history from 09/22/2007 and no changes required. 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 living with husban, daughter and son in [place]; originally from [place]; no  tobacco/ETOH/ellicit drugs; working Systems developer at [employer];    Review of Systems       The patient complains of abdominal pain.  The patient denies fever, weight loss, weight gain, chest pain, and headaches.         endorses some back pain  Physical Exam  General:  vitals reviewed.  alert.  no acute distress Neck:  supple and no masses.   Breasts:  No mass, nodules, thickening, tenderness, bulging, retraction, inflamation, nipple discharge or skin changes noted.   Lungs:  normal respiratory effort, normal breath sounds, no crackles, and no wheezes.   Heart:  normal rate, regular rhythm, and no murmur.   Abdomen:  soft, non-tender, normal bowel sounds, no distention, and no masses.   Genitalia:  normal introitus, no external lesions, mucosa pink and moist, no vaginal or cervical lesions, normal uterus size and position, no adnexal masses or tenderness, and vaginal discharge.   Extremities:  no lower extremity edema   Impression & Recommendations:  Problem # 1:  AMENORRHEA (ICD-626.0) Assessment New U preg positive Orders: U Preg-[facility] (81025) [facility]- Est  Level 4 (56213)  Problem # 2:  VAGINAL DISCHARGE (ICD-623.5) Assessment: New Likely from pregnancy.  Will check GC/Chlamydia and wet prep. Orders: GC/Chlamydia-[facility] ([id number]/87491) Wet Prep- [facility] [phone]) [facility]- Est  Level 4 (  78469)  Problem # 3:  BREAST TENDERNESS (ICD-611.71) Assessment: New  Right breast tenderness.  No definite masses palpated.  Likely from hormonal changes from pregnancy.  Encouraged her to do self checks every month.  If she starts to feel any bump or mass then to return to clinic and she would likely need an ultrasound.  Orders: [facility]- Est  Level 4 (99214)  Complete Medication List: 1)  Ranitidine Hcl 300 Mg Tabs (Ranitidine hcl) .Marland Kitchen.. 1 tablet  a day for stomach 2)  Triple Paste 12.8 % Oint (Zinc oxide) .... Apply small amount three times a day qs: for 1 month 3)  Anusol-hc 2.5 % Crea (Hydrocortisone) ....  Sig apply to anal area twice daily after bathing or sitz baths disp 1 tube instructions in spanish  Other Orders: Urinalysis-[facility] (00000) Pap Smear-[facility] ([id number])  Patient Instructions: 1)  Congratulations, you are pregnant 2)  You need to get set up with [facility] before we can do anything else with your prenatal care 3)  I think that your breast tenderness is coming from your pregnancy.   4)  Do self breast checks at least once a month.  If you start noticing a breast mass or lump please let us know 5)  Please schedule a new OB appointment with me as soon as you get set up with [facility].  Laboratory Results   Urine Tests  Date/Time Received: September 14, 2009 1:34 PM  Date/Time Reported: September 14, 2009 2:24 PM   Routine Urinalysis   Color: yellow Appearance: Cloudy Glucose: negative   (Normal Range: Negative) Bilirubin: negative   (Normal Range: Negative) Ketone: negative   (Normal Range: Negative) Spec. Gravity: 1.015   (Normal Range: 1.003-1.035) Blood: trace-intact   (Normal Range: Negative) pH: 7.5   (Normal Range: 5.0-8.0) Protein: negative   (Normal Range: Negative) Urobilinogen: 1.0   (Normal Range: 0-1) Nitrite: negative   (Normal Range: Negative) Leukocyte Esterace: negative   (Normal Range: Negative)  Urine Microscopic WBC/HPF: rare Bacteria/HPF: 2+ Epithelial/HPF: 10 - 20 Other: 3+ amorphous    Urine HCG: positive Comments: ...............test performed by......Marland KitchenBonnie A. [place], MLS (ASCP)cm  Date/Time Received: September 14, 2009 1:54 PM  Date/Time Reported: September 14, 2009 2:25 PM   Wet [place] Source: vag Bacteria/hpf: 2+  Rods Clue cells/hpf: none  Negative whiff Yeast/hpf: none Trichomonas/hpf: none Comments: ...............test performed by......Marland KitchenBonnie A. [place], MLS (ASCP)cm     Prevention & Chronic Care Immunizations   Influenza vaccine: Not documented    Tetanus booster: 02/21/2005: Done.   Tetanus booster due: 02/22/2015     Pneumococcal vaccine: Not documented  Other Screening   Pap smear: normal  (09/24/2007)   Pap smear action/deferral: Ordered  (09/14/2009)   Pap smear due: 09/23/2008   Smoking status: never  (07/27/2009)   Nursing Instructions: Pap smear today

## 2010-07-18 NOTE — Assessment & Plan Note (Signed)
Summary: hemorrhoids   Vital Signs:  Patient profile:   35 year old female Height:      62 inches Weight:      136.0 pounds BMI:     24.96 Temp:     98.4 degrees F oral Pulse rate:   62 / minute BP sitting:   100 / 63  (right arm) Cuff size:   regular  Vitals Entered By: Gladstone Pih (July 27, 2009 8:39 AM) CC: c/o HEMORRIODS X15 days Is Patient Diabetic? No Pain Assessment Patient in pain? no        Primary Care Provider:  Angelena Sole MD  CC:  c/o HEMORRIODS X15 days.  History of Present Illness: Patient visit conducted in [place].   Alexandria Casey presents today with complaint of pain wtih defecation, is able to palpate small anal mass that is more painful with BM.  Has been present for 2 to 3 weeks. Using Preparation H suppositories without adequate relief.  Had similar problem about 4 months ago, which got better wit hthe suppositories. Never had this problem during or after either of her two pregnancies/deliveries.   Reports worsening constipation.  Denies seeing blood in stool.  Feels bloated when she cannot have a bowel movement..  Started taking fiber tablets two days ago, hasn't helped the constipation.   Family Hx negative for colorectal cancer or inflammatory bowel disease, although mother may have had some sort of "colitis" at one time.  Mother died at age 66 of ovarian cancer.  A maternal great-aunt with breast cancer, no one else in family with breast cancer.   ROS: No fevers, no diarrhea, no nausea/emesis, no dysuria.  LMP 2 weeks prior to this visit, usual timing and duration for her.   Habits & Providers  Alcohol-Tobacco-Diet     Tobacco Status: never  Current Medications (verified): 1)  Ranitidine Hcl 300 Mg Tabs (Ranitidine Hcl) .Marland Kitchen.. 1 Tablet  A Day For Stomach 2)  Triple Paste 12.8 % Oint (Zinc Oxide) .... Apply Small Amount Three Times A Day Qs: For 1 Month 3)  Anusol-Hc 2.5 % Crea (Hydrocortisone) .... Sig Apply To Anal Area Twice Daily After  Bathing or Sitz Baths Disp 1 Tube Instructions in Spanish  Allergies (verified): No Known Drug Allergies  Family History: Reviewed history from 09/22/2007 and no changes required. ? Stomach cancer, + DM, mother & MGM, F-died from accident, M-died at age 29, ovarian CA, No Breast Cancer, No CAD, No Colon Cancer, No HTN    Social History: Reviewed history from 09/22/2007 and no changes required. living with husban, daughter and son in [place]; originally from [place]; no tobacco/ETOH/ellicit drugs; working Systems developer at [employer];    Physical Exam  General:  Well-developed,well-nourished,in no acute distress; alert,appropriate and cooperative throughout examination Abdomen:  Soft, nontender, nondistended.  No masses palpable on exam.  Rectal:  Fleshy external hemorrhoid visible at 6 o=clock position, tender, not thrombosed. No internal masses noted. No fissures visible on inspection.    Impression & Recommendations:  Problem # 1:  EXTERNAL HEMORRHOIDS WITHOUT MENTION COMP (ICD-455.3)  Paqtient with single non-thrombosed hemorroid along the anterior aspect of anus. No internal hemorroids, no fissures. See instructions for plan of epson salts, Anusol HP, fiber and hydration. Explained to patient, solicited questions and lapses in understanding of plan.   Orders: [facility]- Est Level  3 (16109)  Complete Medication List: 1)  Ranitidine Hcl 300 Mg Tabs (Ranitidine hcl) .Marland Kitchen.. 1 tablet  a day for stomach 2)  Triple Paste 12.8 %  Oint (Zinc oxide) .... Apply small amount three times a day qs: for 1 month 3)  Anusol-hc 2.5 % Crea (Hydrocortisone) .... Sig apply to anal area twice daily after bathing or sitz baths disp 1 tube instructions in spanish  Patient Instructions: 1)  1) Recomiendo que [name] tome bastante aqua con fibra, como la que contiene la Metamucil.  Esto se puede comprar sin receta en la farmacia o en [employer]. 2)  2) Le estoy recetando una crema para aplicar a los hemoroides, dos  veces por dia.  3)  3) Recomiendo el uso de una sal que se llama de [employer], que se compra en la farmacia sin receta.  Se mezcla con agua tibia en la banadera para ayudar a encoger a los hemoroides.   4)  4) Si el dolor se le empeora, si comienza con sangrado, si tiene fiebres o escalofrios, o si le llama la atencion cualquier otro tipo de problema, por favor llameme aqui en el consultorio [phone]. Prescriptions: ANUSOL-HC 2.5 % CREA (HYDROCORTISONE) SIG Apply to anal area twice daily after bathing or sitz baths DISP 1 tube Instructions in Spanish  #1 x 1   Entered and Authorized by:   Paula Compton MD   Signed by:   Paula Compton MD on 07/27/2009   Method used:   Electronically to        [facility]* (retail)       [address] [place], [place]  [address]       Ph: [phone]       Fax: [phone]   RxID:   [id number]

## 2010-07-18 NOTE — Assessment & Plan Note (Signed)
Summary: OB/KH   Vital Signs:  Patient profile:   35 year old female Weight:      152 pounds BP sitting:   92 / 59  Vitals Entered By: Jone Baseman CMA (March 09, 2010 10:23 AM) CC: ob   CC:  ob.  Habits & Providers  Alcohol-Tobacco-Diet     Cigarette Packs/Day: na  Allergies: No Known Drug Allergies  Past History:  Past Medical History: Reviewed history from 12/16/2009 and no changes required. 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  alert.  no acute distress Lungs:  normal respiratory effort, normal breath sounds, no crackles, and no wheezes.   Heart:  normal rate, regular rhythm, and no murmur.   Abdomen:  gravid c/w dates. S/NT/ +BS   Impression & Recommendations:  Problem # 1:  PREGNANCY, MULTIGRAVIDA (ICD-V22.1) Assessment Unchanged  Doing well.  Follow up in 2 weeks  Orders: Other OB visit- [facility] (OBCK)  Complete Medication List: 1)  Pnv-dha 27-0.6-0.4-300 Mg Caps (Prenat w/o a-fe-methfol-fa-dha) .Marland Kitchen.. 1 tab by mouth daily  Patient Instructions: 1)  You are doing great 2)  Please schedule a follow up appointment with Dr. Gwendolyn Grant in 2 weeks    Flowsheet View for Follow-up Visit    Estimated weeks of       gestation:     30 6/7    Weight:     152    Blood pressure:   92 / 59    Headache:     No    Nausea/vomiting:   No    Edema:     0    Vaginal bleeding:   no    Vaginal discharge:   no    Fundal height:      30    FHR:       145    Fetal activity:     yes    Labor symptoms:    no    Taking prenatal vits?   Y    Smoking:     na    Next visit:     2 wk    Resident:     Lelon Perla

## 2010-07-18 NOTE — Assessment & Plan Note (Signed)
Summary: ob visit/eo   Vital Signs:  Patient profile:   35 year old female Weight:      150.9 pounds Temp:     98.6 degrees F Pulse rate:   101 / minute Resp:     16 per minute BP sitting:   90 / 56  Primary Care Provider:  Angelena Sole MD   History of Present Illness: No questions or concerns  Habits & Providers  Alcohol-Tobacco-Diet     Cigarette Packs/Day: n/a  Current Medications (verified): 1)  Pnv-Dha 27-0.6-0.4-300 Mg Caps (Prenat W/o A-Fe-Methfol-Fa-Dha) .Marland Kitchen.. 1 Tab By Mouth Daily  Allergies: No Known Drug Allergies  Past History:  Past Medical History: Reviewed history from 12/16/2009 and no changes required. 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 living with husbanb, daughter and son in [place]; originally from [place]; no tobacco/ETOH/ellicit drugs; was working in [employer] of [employer]; however, is no longer working there until she delivers  Physical Exam  General:  vitals reviewed.  alert.  no acute distress Lungs:  normal respiratory effort, normal breath sounds, no crackles, and no wheezes.   Heart:  normal rate, regular rhythm, and no murmur.   Abdomen:  gravid c/w dates. S/NT/ +BS Extremities:  no lower extremity edema   Impression & Recommendations:  Problem # 1:  PREGNANCY, MULTIGRAVIDA (ICD-V22.1) Assessment Unchanged Doing well.  Repeat 1 hr glucola today.  Rh+, antibody negative - Female - Breast feeding - Micronor - IUD Orders: Glucose 1 hr-[facility] (45409) Other OB visit- [facility] ([facility])  Problem # 2:  PLACENTA PREVIA (ICD-641.00) Assessment: Unchanged Repeat u/s scheduled for 02/17/10  Complete Medication List: 1)  Pnv-dha 27-0.6-0.4-300 Mg Caps (Prenat w/o a-fe-methfol-fa-dha) .Marland Kitchen.. 1 tab by mouth daily  Patient Instructions: 1)  You are doing well 2)  Please schedule a follow up appointment with the OB clinic  in 2 weeks   OB Initial Intake Information    Positive HCG by: self    Race: Other    Marital status: Married  FOB Information    Husband/Father of baby: Husband    FOB occupation [employer]  Menstrual History    LMP (date): 07/14/2009    LMP - Character: normal    Menarche: 15 years    Menses interval: 28 days    Menstrual flow 5 days    On BCP's at conception: no   Flowsheet View for Follow-up Visit    Estimated weeks of       gestation:     27 0/7    Weight:     150.9    Blood pressure:   90 / 56    Headache:     No    Nausea/vomiting:   No    Edema:     0    Vaginal bleeding:   no    Vaginal discharge:   no    Fundal height:      26    FHR:       145    Fetal activity:     yes    Labor symptoms:   no    Taking prenatal vits?   Y    Smoking:     n/a    Next visit:     2 wk    Resident:     Lelon Perla    Preceptor:     Sheffield Slider  Comment:     Breast feeding Micronor - IUD Female

## 2010-07-18 NOTE — Assessment & Plan Note (Signed)
Summary: ob/tlb   Vital Signs:  Patient profile:   35 year old female Weight:      140.5 pounds Pulse rate:   83 / minute BP sitting:   95 / 59  Vitals Entered By: Garen Grams LPN (December 17, 1306 8:54 AM)  Primary Care Provider:  Angelena Sole MD   History of Present Illness: Confirmed hx of prior delivery with patient.  Her first pregnancy in [place] she delivered at 38 weeks not 36 weeks as was documented in prior note.  She stated that she was two weeks before her due date.  Discussed Genetic screen with patient and she denied wanting to have it done  Habits & Providers  Alcohol-Tobacco-Diet     Cigarette Packs/Day: n/a  Current Medications (verified): 1)  Pnv-Dha 27-0.6-0.4-300 Mg Caps (Prenat W/o A-Fe-Methfol-Fa-Dha) .Marland Kitchen.. 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 living with husban, daughter and son in [place]; originally from [place]; no tobacco/ETOH/ellicit drugs; was working in [employer] of [employer]; however, is no longer working there until she delivers  Physical Exam  General:  vitals reviewed.  alert.  no acute distress Neck:  supple and no masses.   Lungs:  normal respiratory effort, normal breath sounds, no crackles, and no wheezes.   Heart:  normal rate, regular rhythm, and no murmur.   Abdomen:  gravid c/w dates. S/NT/ +BS Extremities:  no lower extremity edema Psych:  not anxious appearing and not depressed appearing.     Impression & Recommendations:  Problem # 1:  PREGNANCY, MULTIGRAVIDA (ICD-V22.1) Assessment Unchanged  Doing well.  Follow up in 4 weeks.  Repeat u/s at  ~28 weeks  Orders: Other OB visit- [facility] ([facility])  Complete Medication List: 1)  Pnv-dha 27-0.6-0.4-300 Mg Caps (Prenat w/o a-fe-methfol-fa-dha) .Marland Kitchen.. 1 tab  by mouth daily  Patient Instructions: 1)  You are doing great 2)  Please schedule a follow up appointment in 4 weeks   OB Initial Intake Information    Positive HCG by: self    Race: Other    Marital status: Married  FOB Information    Husband/Father of baby: Husband    FOB occupation [employer]  Menstrual History    LMP (date): 07/14/2009    LMP - Character: normal    Menarche: 15 years    Menses interval: 28 days    Menstrual flow 5 days    On BCP's at conception: no   Flowsheet View for Follow-up Visit    Estimated weeks of       gestation:     19 0/7    Weight:     140.5    Blood pressure:   95 / 59    Headache:     No    Nausea/vomiting:   No    Edema:     0    Vaginal bleeding:   no    Vaginal discharge:   no    Fundal height:      19    FHR:       140    Fetal activity:     yes    Labor symptoms:   no    Taking prenatal vits?   Y    Smoking:     n/a  Next visit:     4 wk    Resident:     Lelon Perla    Preceptor:     Leveda Anna

## 2010-07-18 NOTE — Assessment & Plan Note (Signed)
Summary: OB/Mj   Vital Signs:  Patient profile:   35 year old female Weight:      155.50 pounds BP sitting:   99 / 62  Vitals Entered By: Jone Baseman CMA (March 24, 2010 2:30 PM)  Habits & Providers  Alcohol-Tobacco-Diet     Cigarette Packs/Day: n/a  Current Problems (verified): 1)  Abnormal Maternal Glucose Tolerance Antepartum  (ZHY-865.78) 2)  Pregnancy, Multigravida  (ICD-V22.1) 3)  Family History of Malignant Neoplasm Ovary  (ICD-V16.41) 4)  Screening For Malignant Neoplasm of The Cervix  (ICD-V76.2) 5)  Routine Gynecological Examination  (ICD-V72.31) 6)  Leg Pain, Right  (ICD-729.5) 7)  Neck Pain  (ICD-723.1)  Current Medications (verified): 1)  Pnv-Dha 27-0.6-0.4-300 Mg Caps (Prenat W/o A-Fe-Methfol-Fa-Dha) .Marland Kitchen.. 1 Tab By Mouth Daily  Allergies: No Known Drug Allergies  Social History: Reviewed history from 02/10/2010 and no changes required. living with husbanb, daughter and son in [place]; originally from [place]; no tobacco/ETOH/ellicit drugs; was working in the [employer]; however, is no longer working there until she deliversPacks/Day:  n/a  Physical Exam  General:  vitals reviewed.  alert.  no acute distress Lungs:  normal respiratory effort, normal breath sounds, no crackles, and no wheezes.   Heart:  normal rate, regular rhythm, and no murmur.   Abdomen:  gravid c/w dates. S/NT/ +BS   Impression & Recommendations:  Problem # 1:  PREGNANCY, MULTIGRAVIDA (ICD-V22.1) Assessment Unchanged  Doing well.  Follow up in 2 weeks.  Orders: Other OB visit- [facility] ([facility])  Complete Medication List: 1)  Pnv-dha 27-0.6-0.4-300 Mg Caps (Prenat w/o a-fe-methfol-fa-dha) .Marland Kitchen.. 1 tab by mouth daily  Patient Instructions: 1)  You are doing great 2)  Please follow up with Dr. Gwendolyn Grant in 2 weeks 3)  If you notice any decreased movement, vaginal bleeding, or discharge please go to the [facility] for Follow-up Visit  Estimated weeks of       gestation:     33 0/7    Weight:     155.50    Blood pressure:   99 / 62    Headache:     No    Nausea/vomiting:   No    Edema:     0    Vaginal bleeding:   no    Vaginal discharge:   no    Fundal height:      32    FHR:       145    Fetal activity:     yes    Labor symptoms:   no    Taking prenatal vits?   Y    Smoking:     n/a    Next visit:     2 wk    Resident:     Lelon Perla

## 2010-07-18 NOTE — Assessment & Plan Note (Signed)
Summary: ob visit/eo   Vital Signs:  Patient profile:   35 year old female Weight:      146.1 pounds Pulse rate:   80 / minute BP sitting:   103 / 57  Vitals Entered By: Arlyss Repress CMA, (January 13, 2010 10:29 AM)  Habits & Providers  Alcohol-Tobacco-Diet     Cigarette Packs/Day: n/a  Current Medications (verified): 1)  Pnv-Dha 27-0.6-0.4-300 Mg Caps (Prenat W/o A-Fe-Methfol-Fa-Dha) .Marland Kitchen.. 1 Tab By Mouth Daily  Allergies: No Known Drug Allergies  Past History:  Past Medical History: Reviewed history from 12/16/2009 and no changes required. 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 living with husban, daughter and son in [place]; originally from [place]; no tobacco/ETOH/ellicit drugs; was working in [employer]; however, is no longer working there until she delivers  Physical Exam  General:  vitals reviewed.  alert.  no acute distress Lungs:  normal respiratory effort, normal breath sounds, no crackles, and no wheezes.   Heart:  normal rate, regular rhythm, and no murmur.   Abdomen:  gravid c/w dates. S/NT/ +BS Extremities:  no lower extremity edema   Impression & Recommendations:  Problem # 1:  PREGNANCY, MULTIGRAVIDA (ICD-V22.1) Assessment Unchanged  Doing well.  Check 1 hour glucola at next visit.  F/U in 4 weeks in OB clinic.  Orders: Other OB visit- [facility] ([facility]K)  Problem # 2:  PLACENTA PREVIA (ICD-641.00) Assessment: Unchanged Repeat ultrasound after 28 weeks. Orders: Ultrasound (Ultrasound)  Complete Medication List: 1)  Pnv-dha 27-0.6-0.4-300 Mg Caps (Prenat w/o a-fe-methfol-fa-dha) .Marland Kitchen.. 1 tab by mouth daily  Patient Instructions: 1)  You are doing great 2)  We will get you set up for that ultrasound to recheck the location of the placenta 3)  For your next office visit please schedule an appointment with the OB clinic  in 4 weeks   OB Initial Intake Information    Positive HCG by: self    Race: Other    Marital status: Married  FOB Information    Husband/Father of baby: Husband    FOB occupation [employer]  Menstrual History    LMP (date): 07/14/2009    LMP - Character: normal    Menarche: 15 years    Menses interval: 28 days    Menstrual flow 5 days    On BCP's at conception: no   Flowsheet View for Follow-up Visit    Estimated weeks of       gestation:     23 0/7    Weight:     146.1    Blood pressure:   103 / 57    Headache:     No    Nausea/vomiting:   No    Edema:     0    Vaginal bleeding:   no    Vaginal discharge:   no    Fundal height:      23    FHR:       150    Fetal activity:     yes    Labor symptoms:   no    Taking prenatal vits?   Y    Smoking:     n/a    Next visit:     4 wk    Resident:     Lelon Perla    Preceptor:     Mc[place]

## 2010-07-18 NOTE — Assessment & Plan Note (Signed)
Summary: ob visit/eo   Vital Signs:  Patient profile:   35 year old female Weight:      136 pounds BP sitting:   97 / 57  Vitals Entered By: Jone Baseman CMA (November 21, 2009 10:39 AM)  Habits & Providers  Alcohol-Tobacco-Diet     Cigarette Packs/Day: n/a  Current Medications (verified): 1)  Ranitidine Hcl 300 Mg Tabs (Ranitidine Hcl) .Marland Kitchen.. 1 Tablet  A Day For Stomach 2)  Triple Paste 12.8 % Oint (Zinc Oxide) .... Apply Small Amount Three Times A Day Qs: For 1 Month 3)  Anusol-Hc 2.5 % Crea (Hydrocortisone) .... Sig Apply To Anal Area Twice Daily After Bathing or Sitz Baths Disp 1 Tube Instructions in Spanish  Allergies (verified): No Known Drug Allergies   OB Initial Intake Information    Positive HCG by: self    Race: Other    Marital status: Married  FOB Information    Husband/Father of baby: Husband    FOB occupation [employer]  Menstrual History    LMP (date): 07/14/2009    LMP - Character: normal    Menarche: 15 years    Menses interval: 28 days    Menstrual flow 5 days    On BCP's at conception: no  Social History: Reviewed history from 09/22/2007 and no changes required. living with husban, daughter and son in [place]; originally from [place]; no tobacco/ETOH/ellicit drugs; working Systems developer at [employer];    Physical Exam  General:  vitals reviewed.  alert.  no acute distress Lungs:  normal respiratory effort, normal breath sounds, no crackles, and no wheezes.   Heart:  normal rate, regular rhythm, and no murmur.   Abdomen:  soft, non-tender, normal bowel sounds, no distention, and no masses.   Msk:  SI joint pain Extremities:  no lower extremity edema   Impression & Recommendations:  Problem # 1:  PREGNANCY, MULTIGRAVIDA (ICD-V22.1) Assessment Unchanged Doing well.  Complaining of some back pain but no red flags.   Orders: Ultrasound (Ultrasound) AFP/Quad Scr-[facility] ([id number]) Other OB visit- [facility] ([facility]BCK)  Complete Medication List: 1)   Ranitidine Hcl 300 Mg Tabs (Ranitidine hcl) .Marland Kitchen.. 1 tablet  a day for stomach 2)  Triple Paste 12.8 % Oint (Zinc oxide) .... Apply small amount three times a day qs: for 1 month 3)  Anusol-hc 2.5 % Crea (Hydrocortisone) .... Sig apply to anal area twice daily after bathing or sitz baths disp 1 tube instructions in spanish  Patient Instructions: 1)  You are doing great 2)  We will schedule you for an ultrasound to look at your baby's anatomy and figure out the sex 3)  Please schedule a follow up appointment in 4 weeks    Flowsheet View for Follow-up Visit    Estimated weeks of       gestation:     15 3/7    Weight:     136    Blood pressure:   97 / 57    Headache:     few    Nausea/vomiting:   No    Edema:     0    Vaginal bleeding:   no    Vaginal discharge:   white d/c    Fundal height:      15    FHR:       150    Fetal activity:     yes    Labor symptoms:   no    Cx Dilation:     0  Cx Effacement:   0%    Cx Station:     -3    Taking prenatal vits?   Y    Smoking:     n/a    Next visit:     4 wk    Resident:     Lelon Perla    Preceptor:     [place]

## 2010-07-20 NOTE — Assessment & Plan Note (Signed)
Summary: post-parto/mj   Vital Signs:  Patient profile:   35 year old female Height:      62 inches Weight:      149.8 pounds BMI:     27.50 Temp:     98.1 degrees F oral Pulse rate:   67 / minute BP sitting:   103 / 65  (left arm) Cuff size:   regular  Vitals Entered By: Garen Grams LPN (July 06, 2010 9:45 AM) CC: postpartum check Is Patient Diabetic? No Pain Assessment Patient in pain? no        Primary Care Provider:  Angelena Sole MD  CC:  postpartum check.  History of Present Illness: 1. Postpartum Check  Pt is about 6-7 weeks s/p NSVD of her baby boy Apolinar Junes).  She had a normal pregnancy and delivery.  No complications.  Went home after delivery.  Questions / Concers: 1. Not sleeping as well because of the baby 2. Headaches: having to take Ibuprofen because of the headaches, which does seem to help.  She has a hx of headaches.  Feels like her normal headaches.  Denies a headache today. 3. leg swelling:  For about 2 weeks after her delivery she was having leg swelling.  This has improved.  Now she gets it a little bit after being on her feet all day but it is improved with laying down. 4. Acid reflux:  Her stomach has been a little upset since she has been taking the Ibuprofen  Contraception:  Plans on taking birth control pills until [facility]  Mood:  Endorses a good mood, no depression  Denies any vaginal bleeding  Habits & Providers  Alcohol-Tobacco-Diet     Tobacco Status: never     Cigarette Packs/Day: n/a  Current Medications (verified): 1)  Pnv-Dha 27-0.6-0.4-300 Mg Caps (Prenat W/o A-Fe-Methfol-Fa-Dha) .Marland Kitchen.. 1 Tab By Mouth Daily 2)  Ibuprofen 800 Mg Tabs (Ibuprofen) .Marland Kitchen.. 1 Tab By Mouth Every 8 Hours As Needed For Pain 3)  Ranitidine Hcl 75 Mg Tabs (Ranitidine Hcl) .Marland Kitchen.. 1 Tab By Mouth Daily For Acid Reflux 4)  Norethindrone 0.35 Mg Tabs (Norethindrone) .Marland Kitchen.. 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 living with husbanb, daughter and son in [place]; originally from [place], [place]; no tobacco/ETOH/ellicit drugs; was working in the [employer] of a [employer]; plans to return to work  Physical Exam  General:  Vitals reviewed.  Normotensive.  Comfortable. No acute distress. Eyes:  Normal conjunctiva Mouth:  good dentition and pharynx pink and moist.   Neck:  supple, full ROM, and no masses.   Lungs:  normal respiratory effort, normal breath sounds, no crackles, and no wheezes.   Heart:  Regular rate and rhythm without murmur, rub, or gallop.  Normal S1/S2  Abdomen:  soft, normal bowel sounds, no distention, no masses, no guarding, no rigidity, and no rebound tenderness.   Minimally tender in lower quadrants. Msk:  no joint tenderness and no joint swelling.   Extremities:  no LE edema Neurologic:  alert & oriented X3, cranial nerves II-XII intact, strength normal in all extremities, gait normal, and DTRs symmetrical and normal.   Skin:  turgor normal and color normal.   Psych:  normally  interactive, good eye contact, not anxious appearing, and not depressed appearing.     Impression & Recommendations:  Problem # 1:  ROUTINE POSTPARTUM FOLLOW-UP (ICD-V24.2) Assessment New  Doing well.  No bleeding.  Mood stable.  Micronor - Copper IUD.  Breastfeeding.  F/U in 2-3 months for annual and PAP.  Orders: [facility]partum visit- [facility] ([address])  Problem # 2:  HEADACHE (ICD-784.0) Assessment: Unchanged  No red flags.  Blood pressure normal.  Continue Ibuprofen. Her updated medication list for this problem includes:    Ibuprofen 800 Mg Tabs (Ibuprofen) .Marland Kitchen... 1 tab by mouth every 8 hours as needed for pain  [id number]  Problem # 3:  GERD (ICD-530.81) Assessment: New  Start Ranitidine Her updated medication list for this problem includes:    Ranitidine Hcl 75 Mg Tabs (Ranitidine hcl) .Marland Kitchen... 1 tab by mouth daily for acid reflux  Orders: Postpartum visit- [facility] ([id number])  Complete Medication List: 1)  Pnv-dha 27-0.6-0.4-300 Mg Caps (Prenat w/o a-fe-methfol-fa-dha) .Marland Kitchen.. 1 tab by mouth daily 2)  Ibuprofen 800 Mg Tabs (Ibuprofen) .Marland Kitchen.. 1 tab by mouth every 8 hours as needed for pain 3)  Ranitidine Hcl 75 Mg Tabs (Ranitidine hcl) .Marland Kitchen.. 1 tab by mouth daily for acid reflux 4)  Norethindrone 0.35 Mg Tabs (Norethindrone) .Marland Kitchen.. 1 tab by mouth at the same time everyday  Patient Instructions: 1)  Congratulations, I'm very happy for you.  Apolinar Junes is adorable! 2)  I have sent in prescriptions for Ibuprofen and Ranitidine to [facility] 3)  I have also sent in a prescription for a [facility] control pills to [facility] 4)  Please take those until you get the IUD placed 5)  Please schedule a follow up appointment in 2-3 months for your annual exam and PAP smear  Prescriptions: NORETHINDRONE 0.35 MG TABS (NORETHINDRONE) 1 tab by mouth at the same time everyday  #30 x 6   Entered and Authorized by:   Angelena Sole MD   Signed by:   Angelena Sole MD on 07/06/2010   Method used:   Faxed to ...       [facility] (retail)       [address] [place], [place]  [address]       Ph: [phone]       Fax: [phone]   RxID:   [id number] RANITIDINE HCL 75 MG TABS (RANITIDINE HCL) 1 tab by mouth daily for acid reflux  #30 x 3   Entered and Authorized by:   Angelena Sole MD   Signed by:   Angelena Sole MD on 07/06/2010   Method used:   Electronically to        [facility]* (retail)       [address] [place], [place]  [address]       Ph: [phone]       Fax: [phone]   RxID:   [id number] IBUPROFEN 800 MG TABS (IBUPROFEN)  1 tab by mouth every 8 hours as needed for pain  #40 x 1   Entered and Authorized by:   Angelena Sole MD   Signed by:   Angelena Sole MD on 07/06/2010   Method used:   Electronically to        [facility]* (retail)       [address]  [place], [place]  [address]       Ph: [phone]       Fax: [phone]   RxID:   [id number]    Orders Added: 1)  Postpartum visit- [facility] [[id number]]

## 2010-08-26 DIAGNOSIS — Z975 Presence of (intrauterine) contraceptive device: Secondary | ICD-10-CM

## 2010-08-26 HISTORY — DX: Presence of (intrauterine) contraceptive device: Z97.5

## 2010-08-29 LAB — CBC
HCT: 37.1 % (ref 36.0–46.0)
MCHC: 34.2 g/dL (ref 30.0–36.0)
MCV: 85.8 fL (ref 78.0–100.0)
RDW: 13.2 % (ref 11.5–15.5)

## 2010-09-05 LAB — GLUCOSE, CAPILLARY
Glucose-Capillary: 148 mg/dL — ABNORMAL HIGH (ref 70–99)
Glucose-Capillary: 77 mg/dL (ref 70–99)

## 2010-09-26 ENCOUNTER — Ambulatory Visit (INDEPENDENT_AMBULATORY_CARE_PROVIDER_SITE_OTHER): Payer: Self-pay | Admitting: Family Medicine

## 2010-09-26 DIAGNOSIS — N898 Other specified noninflammatory disorders of vagina: Secondary | ICD-10-CM

## 2010-09-26 DIAGNOSIS — Z30431 Encounter for routine checking of intrauterine contraceptive device: Secondary | ICD-10-CM

## 2010-09-26 DIAGNOSIS — N760 Acute vaginitis: Secondary | ICD-10-CM

## 2010-09-26 LAB — POCT WET PREP (WET MOUNT): Trichomonas Wet Prep HPF POC: NEGATIVE

## 2010-09-26 NOTE — Assessment & Plan Note (Signed)
IUD appears in place.  Strings visible.  Educated patient on how to check for the strings.

## 2010-09-26 NOTE — Assessment & Plan Note (Signed)
Scant yellow discharge.  No cervical motion tenderness.  Not sexually active since insertion.  No pain.  No bleeding.  No fevers.  Likely normal physiologic discharge.  Will check wet prep.

## 2010-09-26 NOTE — Patient Instructions (Signed)
Dispositivos anticonceptivos (DIUs) (Contraceptive Devices, IUDs) DIU significa Dispositivo Intrauterino. Intrauterino significa que est dentro del tero. El propsito del DIU es Location manager. El DIU dificulta que los espermatozoides ingresen al tero y a las trompas de Falopio, Environmental consultant en el cual se fertilizan los vulos. Tambin alteran las secreciones del cuello del tero, lo que lo convierte en una barrera ms fuerte para impedir el paso de los espermatozoides. Tambin afectan el recubrimiento interno del tero, lo que dificulta que el vulo se implante. Los DIUs no producen aborto. Hay dos tipos de DIU disponibles:  El DIU de cobre [place] pequea cantidad de cobre dentro del tero. Esto evita que los espermatozoides ingresen al tero y suban por las trompas de Falopio donde el vulo se fertiliza. El DIU de cobre adems puede daar el vulo o prevenir que el vulo fertilizado se adhiera al recubrimiento interior del tero. Puede dejarse colocado durante 10 aos. El DIU de cobre se [place] como dispositivo anticonceptivo de Associate Professor, si se inserta dentro de los [address]  [address] de tener relaciones sexuales sin proteccin.   El DIU hormonal contiene progestina (progesterona sinttica) y Antony Blackbird esta hormona dentro del tero. La hormona hace ms espesa la mucosidad del cuello del tero, lo que evita que los espermatozoides ingresen al tero. Tambin debilita los espermatozoides que ingresan, de forma tal que el espermatozoide y el vulo no podrn mantenerse en la trompa de falopio. Tambin hace que la membrana que recubre internamente al tero se vuelva ms fina, lo que dificulta que el vulo fertilizado se adhiera al tero. La hormona progestina en el DIU disminuye la cantidad de sangrado durante el perodo menstrual y puede ser beneficioso en mujeres que tienen perodos menstruales profusos. El DIU hormonal puede dejarse en el lugar durante 5 aos.  EFECTOS SECUNDARIOS DEL DIU  Pueden producirse clicos  o mayor dolor durante los perodos.   Puede producir perodos ms fuertes y prolongados, lo que puede provocar una falta de glbulos rojos (anemia) e interferir con su actividad diaria o sexual.  Este mtodo anticonceptivo no suele ser la mejor opcin en mujeres que tienen perodos abundantes o prolongados. Una mejor opcin puede ser el uso de pastillas anticonceptivas. Los DIUs funcionan mejor en mujeres que ya han tenido un [place], porque su cuello uterino est ms abierto, lo que hace que la insercin del dispositivo sea ms fcil y menos dolorosa. Sin embargo, muchas mujeres sin hijos Advertising account planner. Uno de los [employer]epsi[employer] principales en la seleccin de pacientes es prevenir la colocacin no intencional de un DIU en pacientes que tienen una ETS (enfermedad de transmisin sexual), que estn en un riesgo alto de ETS, o que ya estn embarazadas. Por esta razn el DIU se inserta durante, o bien inmediatamente despus, de un perodo menstrual. RAZONES POR LAS CUALES NO SE DEBE UTILIZAR UN DIU  La forma del tero o del cuello uterino no es normal.   Usted tiene o ha tenido una infeccin plvica, tal como una ETS, en los ltimos 3 meses.   Tiene o sospecha tener [employer] rganos femeninos.   Se ha realizado un papanicolau y el resultado es anormal.   Sufre de determinadas enfermedades del hgado.   Existe una infeccin o inflamacin grave en el cuello uterino (cervicitis).   Presenta una hemorragia vaginal sin motivo determinado.   Sufre trastornos de las vlvulas coronarias (a menos que un cardilogo lo permita).   Es alrgica al cobre (esto es poco frecuente).   Ha tenido un  embarazo fuera del tero (embarazo ectpico).   Est embarazada o piensa que podra estarlo.   Ha tenido perodos profusos o prolongados, o fuertes dolores o calambres durante los perodos (excepto para el DIU hormonal).   Sufre o sospecha tener cncer plvico.   Tiene una ETS.   Tiene problemas en el tero o  en el cuello uterino (estenosis cervical, miomas uterinos) que dificultan la insercin del DIU.  Se debe remover el DIU cuando la mujer llega a la menopausia o [place]. BENEFICIOS  Usted estar siempre preparada para Management consultant.   El DIU de cobre no interfiere con sus hormonas femeninas.   Puede usarse como anticonceptivo de Associate Professor.   Puede utilizarse mientras se [place].   Funciona inmediatamente luego de la insercin, y no produce problemas para embarazarse luego de ser removido.   No interfiere con el juego sexual previo.   El DIU de progesterona puede hacer que los perodos menstruales profusos sean ms leves.   El DIU de progesterona puede utilizarse durante 5 aos.   El DIU de cobre puede utilizarse durante 10 aos.  RIESGOS  Colocar el DIU atravesando el tero, hacia la pelvis o el abdomen (perforacin del tero).   Perder el DIU (expulsin). Esto es ms comn en mujeres que nunca han tenido hijos.   Al embarazarse con un DIU, existe una mayor posibilidad de Neomia Dear infeccin y prdida del Psychiatrist.   Embarazo en la trompa de Falopio (ectpico).   ETS, en mujeres que tienen ms de una pareja sexual. El DIU no protege contra enfermedades de transmisin sexual.   Otros efectos secundarios menores pueden incluir:   Dolor de Training and development officer.   Ganas de vomitar (nuseas).   Inflamacin mamaria.   Depresin.  PROCEDIMIENTO  El DIU se inserta durante el perodo menstrual o inmediatamente luego de finalizado, para asegurarse de que no est embarazada.   Se le proveer un formulario de consentimiento con informacin acerca del DIU, cmo funciona, cmo se coloca, y sus efectos secundarios.   Deber recostarse en una camilla de exmenes, desnuda de la cintura para abajo.   El profesional la examinar para Warehouse manager tamao y la posicin del tero.   Generalmente no es necesario Surveyor, mining.   El profesional podr darle una pldora para tomar  una o dos horas antes del procedimiento.   En ocasiones, se utiliza un bloqueo paracervical para bloquear y Chief Operating Officer las Scientist, water quality.   Se colocar una herramienta (espculo) en la vagina de forma tal que el profesional pueda ver el cuello uterino.   Se enviar una seal sonora hacia el tero para verificar la profundidad del mismo.   Un instrumento delgado (insertador DIU), que tiene forma similar a un sorbete para beber, se inserta a travs de la pequea apertura del cuello uterino y [place].   Luego se empuja el DIU con un [place], similar a [place], y Geophysical data processor se [place]. Pueden producirse calambres y algo de dolor durante la insercin.   Reljese para reducir las molestias.   Luego del procedimiento, es probable que pierda algo de [place]. Lleve toallas higinicas. Evite el uso de tampones durante 2 semanas. Es normal que sangre luego del procedimiento. Vara desde ligeras manchas durante algunos [employer] un sangrado similar a la menstruacin que puede durar hasta tres semanas. Puede ser [employer] menstruacin si est cerca del tiempo que tiene su perodo normal.   Tambin puede experimentar ligeros calambres. Solo tome medicamentos que se  pueden comprar sin receta o recetados para Chief Technology Officer, Dentist o fiebre, como le indica el mdico. No utilice aspirinas, ya que puede agravar el Star.   Practique verificar la cuerda que sale del cuello uterino, para asegurarse de que el DIU se encuentra en el tero.  INSTRUCCIONES PARA EL CUIDADO DOMICILIARIO  No conduzca vehculos por 24 horas.   Solo tome medicamentos que se pueden comprar sin receta o recetados para Chief Technology Officer, Dentist o fiebre, como le indica el mdico.   No utilice tampones, no se haga duchas vaginales ni tenga relaciones sexuales durante 2 semanas, o hasta que el profesional que la asiste lo apruebe.   Haga reposo en su casa durante 24 horas. Luego podr retornar a sus [employer], a menos que [employer] indique otra cosa.   Controle su DIU antes de [employer] sexual, para asegurarse de que est en Nature conservation officer. Asegrese de que puede sentir las cuerdas. Un DIU puede empujarse y perderse sin notarlo. Adems, si las cuerdas se sienten ms largas, puede significar que el DIU est salindose fuera del tero. Esto significa que no est brindando una correcta proteccin TEFL teacher.   [employer] medicamentos y en la forma que el profesional le ha indicado.   Asegrese de asistir a la cita de seguimiento, para que el profesional pueda asegurarse de que el DIU se Neurosurgeon. Luego de ese control, se recomienda realizar exmenes anuales, a menos que no pueda sentir las cuerdas del [address]   Controle que el DIU est en su lugar sintiendo las cuerdas luego de cada perodo menstrual.  SOLICITE ATENCIN MDICA SI:  La hemorragia es ms abundante que un ciclo menstrual normal.   La temperatura oral se eleva sin motivo por arriba de 102.   Siente calambres o un dolor que aumenta y que no se alivia con Tourist information centre manager. O si siente dolor abdominal que no parece estar relacionado con la misma zona en que sinti los clicos y Chief Technology Officer.   Se siente mareada, inusualmente dbil o tiene episodios de desmayo.   Comienza a Water engineer parte superior de los hombros (zona de los breteles).   Tiene problemas o dudas, que no han sido aclaradas por completo por el profesional.   Siente dolor abdominal.   Siente dolor durante la actividad sexual.   No puede sentir las cuerdas del DIU.   Anola Gurney una hemorragia vaginal anormal.   Siente el DIU en la apertura del cuello uterino en la vagina.   Sospecha que est embarazada.   Falta del periodo menstrual.   La cuerda del DIU est lastimando a su pareja sexual.   La cuerda del DIU se ha alargado.  Document Released: 11/22/2009   [facility] Patient Information 2011 [place], [place].

## 2010-09-26 NOTE — Progress Notes (Signed)
  Subjective:    Patient ID: Alexandria Casey, female    DOB: 16-Mar-1976, 35 y.o.   MRN: [id number]  HPI 1. Contraceptive management:  Pt had an IUD put in about 4 weeks ago at the [facility].  She is now here to follow up on its insertion.  She is not having any pain or bleeding.  She is complaining of some yellow discharge that was initially significant but has started to decrease.  Denies any foul smell.   Review of Systems Denies fevers, chills, abdominal pain, cramps    Objective:   Physical Exam  Constitutional: She appears well-nourished. No distress.  Cardiovascular: Normal rate and regular rhythm.   Pulmonary/Chest: Effort normal and breath sounds normal. No respiratory distress. She has no wheezes.  Abdominal: Soft. Bowel sounds are normal. She exhibits no distension. There is no tenderness. There is no rebound.  Genitourinary: Vagina normal and uterus normal.       Scant yellow discharge.  IUD strings seen.  IUD in place.  No cervical motion tenderness.  No vaginal bleeding.  Skin: Skin is warm and dry. No rash noted.  Psychiatric: She has a normal mood and affect.          Assessment & Plan:

## 2010-10-02 LAB — URINALYSIS, ROUTINE W REFLEX MICROSCOPIC
Bilirubin Urine: NEGATIVE
Glucose, UA: NEGATIVE mg/dL
Ketones, ur: 15 mg/dL — AB
Leukocytes, UA: NEGATIVE
pH: 7 (ref 5.0–8.0)

## 2010-10-02 LAB — URINE MICROSCOPIC-ADD ON

## 2010-10-02 LAB — DIFFERENTIAL
Basophils Absolute: 0 10*3/uL (ref 0.0–0.1)
Basophils Relative: 0 % (ref 0–1)
Monocytes Absolute: 0.5 10*3/uL (ref 0.1–1.0)
Neutro Abs: 6.1 10*3/uL (ref 1.7–7.7)
Neutrophils Relative %: 73 % (ref 43–77)

## 2010-10-02 LAB — COMPREHENSIVE METABOLIC PANEL
Alkaline Phosphatase: 59 U/L (ref 39–117)
BUN: 10 mg/dL (ref 6–23)
Chloride: 102 mEq/L (ref 96–112)
Creatinine, Ser: 0.36 mg/dL — ABNORMAL LOW (ref 0.4–1.2)
GFR calc non Af Amer: >60 mL/min (ref >60)
Glucose, Bld: 86 mg/dL (ref 70–99)
Potassium: 3.7 mEq/L (ref 3.5–5.1)
Total Bilirubin: 0.4 mg/dL (ref 0.3–1.2)

## 2010-10-02 LAB — CBC
HCT: 36.9 % (ref 36.0–46.0)
Hemoglobin: 12.4 g/dL (ref 12.0–15.0)
MCV: 84.9 fL (ref 78.0–100.0)
WBC: 8.4 10*3/uL (ref 4.0–10.5)

## 2010-10-02 LAB — PREGNANCY, URINE: Preg Test, Ur: NEGATIVE

## 2011-01-23 ENCOUNTER — Encounter: Payer: Self-pay | Admitting: Family Medicine

## 2011-01-23 ENCOUNTER — Other Ambulatory Visit (HOSPITAL_COMMUNITY)
Admission: RE | Admit: 2011-01-23 | Discharge: 2011-01-23 | Disposition: A | Discharge status: Home / Self Care | Payer: Self-pay | Source: Ambulatory Visit | Attending: Family Medicine | Admitting: Family Medicine

## 2011-01-23 ENCOUNTER — Ambulatory Visit (INDEPENDENT_AMBULATORY_CARE_PROVIDER_SITE_OTHER): Payer: Self-pay | Admitting: Family Medicine

## 2011-01-23 VITALS — BP 106/57 | HR 65 | Temp 97.9°F | Wt 138.0 lb

## 2011-01-23 DIAGNOSIS — Z01419 Encounter for gynecological examination (general) (routine) without abnormal findings: Secondary | ICD-10-CM | POA: Insufficient documentation

## 2011-01-23 DIAGNOSIS — R5383 Other fatigue: Secondary | ICD-10-CM | POA: Insufficient documentation

## 2011-01-23 DIAGNOSIS — R5381 Other malaise: Secondary | ICD-10-CM

## 2011-01-23 DIAGNOSIS — Z30431 Encounter for routine checking of intrauterine contraceptive device: Secondary | ICD-10-CM

## 2011-01-23 DIAGNOSIS — B351 Tinea unguium: Secondary | ICD-10-CM | POA: Insufficient documentation

## 2011-01-23 LAB — CBC
HCT: 40 % (ref 36.0–46.0)
MCHC: 32.3 g/dL (ref 30.0–36.0)
RDW: 13.2 % (ref 11.5–15.5)

## 2011-01-23 LAB — T4, FREE: Free T4: 1.08 ng/dL (ref 0.80–1.80)

## 2011-01-23 NOTE — Assessment & Plan Note (Signed)
Discuss with pt on proper foot wear and foot hygiene, to attempt OTC for longer period of time.  Gave choice of lamisil but discussed potential side effects and pt declined.

## 2011-01-23 NOTE — Assessment & Plan Note (Signed)
Strings seen in place and proper showed pt how to check string again as well.

## 2011-01-23 NOTE — Assessment & Plan Note (Signed)
With hair loss, irregularities mild weight gain will get TSH to make sure not thyroid. Will have [employer] call with results.  If needed will have pt call to come back in for medication to begin.

## 2011-01-23 NOTE — Progress Notes (Signed)
  Subjective:    Patient ID: Alexandria Casey, female    DOB: [birth year], 35 y.o.   MRN: [id number]  HPI  Fatigue-  Pt has noticed she has been a lot more tired recently and not feeling well.  Pt states she has  Had some hair loss and her skin feels very dry, no change in soap or detergent.  Pt denies swelling but does state she has had trouble with her bowels being more constipated. No hx of thyroid but hx of DM in family.   Pt has a mirena and wants the strings checked.   Pt states she tried about a month ago and could not feel or see them with a mirror. Pt has been seen before for same complaint.  Pt gives hx of having a mirena fall out previously. Has had only one period since stopping breast feeding and everything is normal.   Foot/nail problems.  Pt states she has had this fungus for about 5 years and nothing has seemed to help.  Pt has tried many creams but only for a week or two at a time.  Pt was wondering if anything we could prescribe her.  NO pain, no bleeding just an eye sore per pt.    Pt only speaks spanish and have interpretor in room.     Review of Systems Denies fever, chills, nausea vomiting abdominal pain, dysuria, chest pain, shortness of breath dyspnea on exertion or numbness in extremities    Past medical history, social, surgical and family history all reviewed.   Objective:   Physical Exam BP 106/57  Pulse 65  Temp(Src) 97.9 F (36.6 C) (Oral)  Wt 138 lb (62.596 kg) General appearance: alert and cooperative Lungs: clear to auscultation bilaterally Heart: regular rate and rhythm, S1, S2 normal, no murmur, click, rub or gallop Abdomen: soft, non-tender; bowel sounds normal; no masses,  no organomegaly Pelvic: cervix normal in appearance, external genitalia normal, no adnexal masses or tenderness, no cervical motion tenderness, rectovaginal septum normal, uterus normal size, shape, and consistency, vagina normal without discharge and mild increase in  mucous, and mirena strings seen. 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

## 2011-01-25 ENCOUNTER — Telehealth (HOSPITAL_COMMUNITY): Payer: Self-pay | Admitting: Family Medicine

## 2011-01-25 NOTE — Telephone Encounter (Signed)
I called pt and let pt know about lab result, pt is very happy and please with our services.  [facility]

## 2011-10-12 ENCOUNTER — Ambulatory Visit (INDEPENDENT_AMBULATORY_CARE_PROVIDER_SITE_OTHER): Payer: Self-pay | Admitting: Family Medicine

## 2011-10-12 VITALS — BP 99/66 | HR 76 | Temp 97.6°F | Wt 144.5 lb

## 2011-10-12 DIAGNOSIS — L600 Ingrowing nail: Secondary | ICD-10-CM

## 2011-10-12 DIAGNOSIS — B351 Tinea unguium: Secondary | ICD-10-CM

## 2011-10-12 DIAGNOSIS — R945 Abnormal results of liver function studies: Secondary | ICD-10-CM

## 2011-10-12 LAB — COMPREHENSIVE METABOLIC PANEL
AST: 16 U/L (ref 0–37)
Albumin: 4.7 g/dL (ref 3.5–5.2)
Alkaline Phosphatase: 68 U/L (ref 39–117)
BUN: 13 mg/dL (ref 6–23)
Creat: 0.45 mg/dL — ABNORMAL LOW (ref 0.50–1.10)
Potassium: 4 mEq/L (ref 3.5–5.3)

## 2011-10-12 MED ORDER — TERBINAFINE HCL 250 MG PO TABS: 250.0000 mg | ORAL_TABLET | Freq: Every day | ORAL | Status: DC

## 2011-10-12 MED ORDER — CEPHALEXIN 500 MG PO CAPS: 500.0000 mg | ORAL_CAPSULE | Freq: Two times a day (BID) | ORAL | Status: AC

## 2011-10-12 NOTE — Patient Instructions (Addendum)
2 medicines:  1 for infection in toe.  Take 1 pill in AM and PM with food.   1 for toenail.  Take 1 pill a day for 30 days. On the way out set up a lab appt in 30 days.

## 2011-10-15 NOTE — Assessment & Plan Note (Signed)
Obtain baseline CMET today.  Provided 1 month oral terbinafine.  She is to make appt today for lab FU in 1 month.  If no change in LFTs at that time, will provide refill for Lamisil.

## 2011-10-15 NOTE — Progress Notes (Signed)
  Subjective:    Patient ID: Alexandria Casey, female    DOB: 06-07-1976, 36 y.o.   MRN: [id number]  HPI  1.  Toenail fungus:  Present for about 1 year.  Brought to attention of prior PCP who was hesitant about oral anti-fungals due to risk of liver toxicity.  Has tried Lamisil daily x 1 year without much improvement.  No pain or tenderness.  No redness.   Review of Systems See HPI above for review of systems.       Objective:   Physical Exam  Gen:  Alert, cooperative patient who appears stated age in no acute distress.  Vital signs reviewed. Toenail:  Right great toe with onychomycotic features.  No redness, infection, ingrown toe. Rest of toenails within normal limits.        Assessment & Plan:

## 2011-11-13 ENCOUNTER — Other Ambulatory Visit: Payer: Self-pay

## 2011-11-13 DIAGNOSIS — L600 Ingrowing nail: Secondary | ICD-10-CM

## 2011-11-13 DIAGNOSIS — R945 Abnormal results of liver function studies: Secondary | ICD-10-CM

## 2011-11-13 LAB — COMPREHENSIVE METABOLIC PANEL
BUN: 11 mg/dL (ref 6–23)
CO2: 25 mEq/L (ref 19–32)
Calcium: 9.6 mg/dL (ref 8.4–10.5)
Chloride: 105 mEq/L (ref 96–112)
Creat: 0.43 mg/dL — ABNORMAL LOW (ref 0.50–1.10)

## 2011-11-13 NOTE — Progress Notes (Signed)
CMP DONE TODAY Pierrette Scheu 

## 2012-01-09 ENCOUNTER — Encounter: Payer: Self-pay | Admitting: Family Medicine

## 2012-01-09 ENCOUNTER — Ambulatory Visit (INDEPENDENT_AMBULATORY_CARE_PROVIDER_SITE_OTHER): Payer: Self-pay | Admitting: Family Medicine

## 2012-01-09 VITALS — BP 101/58 | HR 65 | Temp 98.5°F | Ht 63.0 in | Wt 144.8 lb

## 2012-01-09 DIAGNOSIS — B351 Tinea unguium: Secondary | ICD-10-CM

## 2012-01-09 MED ORDER — TERBINAFINE HCL 250 MG PO TABS: 250.0000 mg | ORAL_TABLET | Freq: Every day | ORAL | Status: AC

## 2012-01-09 MED ORDER — RANITIDINE HCL 75 MG PO TABS: 75.0000 mg | ORAL_TABLET | Freq: Every day | ORAL | Status: DC

## 2012-01-09 NOTE — Patient Instructions (Addendum)
Denae,  Thank you for coming in today.  I have refilled your ranitidine.  For your nails: 1. lamisil 1 tab daily for one more month. 2. If you still have nail fungus: come back for repeat LFTs and trial or a different medication (itraconazole).   Dr. Armen Pickup

## 2012-01-09 NOTE — Progress Notes (Signed)
Subjective:     Patient ID: Alexandria Casey, female   DOB: 03/19/1976, 36 y.o.   MRN: [id number]  HPI 36 yo F presents to f/u onychomycosis. She has completed a one month course of Lamisil for her toenails. The thickening and yellowing has resolved in her toes except for the R great toe. She also has thickening and yellowing in her fingers x 4 months that has persisted. She denies redness and pain around the nail bed.   Review of Systems As per HPI     Objective:   Physical Exam BP 101/58  Pulse 65  Temp 98.5 F (36.9 C) (Oral)  Ht 5\' 3"  (1.6 m)  Wt 144 lb 12.8 oz (65.681 kg)  BMI 25.65 kg/m2 General appearance: alert, cooperative and no distress Nails: thickening and yellowing around the nail edge. White debris under the finger nails. R great toenail yellow, thick and uneven growth.   Lab Results  Component Value Date   ALT 11 11/13/2011   AST 14 11/13/2011   ALKPHOS 66 11/13/2011   BILITOT 0.4 11/13/2011      Assessment and Plan:

## 2012-01-09 NOTE — Assessment & Plan Note (Signed)
A: improved yet persistent. P: Repeat Lamisil x 1 month. F/u prn persistence for repeat LFTs and course of itraconazole. 1 tab daily x 1 week, off x 21 days, repeat x 1.

## 2012-04-17 ENCOUNTER — Encounter: Payer: Self-pay | Admitting: Family Medicine

## 2012-04-17 ENCOUNTER — Telehealth: Payer: Self-pay | Admitting: Family Medicine

## 2012-04-17 ENCOUNTER — Ambulatory Visit (INDEPENDENT_AMBULATORY_CARE_PROVIDER_SITE_OTHER): Payer: Self-pay | Admitting: Family Medicine

## 2012-04-17 VITALS — BP 92/50 | HR 70 | Temp 98.0°F | Wt 143.7 lb

## 2012-04-17 DIAGNOSIS — K37 Unspecified appendicitis: Secondary | ICD-10-CM

## 2012-04-17 DIAGNOSIS — R102 Pelvic and perineal pain unspecified side: Secondary | ICD-10-CM | POA: Insufficient documentation

## 2012-04-17 DIAGNOSIS — N760 Acute vaginitis: Secondary | ICD-10-CM

## 2012-04-17 LAB — POCT WET PREP (WET MOUNT)

## 2012-04-17 NOTE — Assessment & Plan Note (Signed)
Mostly discomfort after sexual intercourse. No dyspareunia, no irregular bleeding. Negative physical exam for PID, no adnexal mass. IUD in place. Plan -Wet prep. - Symptomatic treatment with NSAIDs as PRN. - Recommended position change during coitus and monitor symptoms. - F/u if symptoms don't resolve or new symptoms appear.

## 2012-04-17 NOTE — Progress Notes (Signed)
Family Medicine Office Visit Note   Subjective:   Patient ID: Alexandria Casey, female  DOB: May 18, 1976, 36 y.o.. MRN: [id number]   Pt that comes today to have IUD check. Reports that 24-48h after sexual intercourse she feels her pelvic area inflamed and bloated.She complains of thin white vaginal discharge and denies bleeding, pruritus or odor. Also denies fever or chills.   Review of Systems:  Per HPI  Objective:   Physical Exam: Gen:  NAD Vulva and perianal area: Normal  Speculum: Vagina and cervix of normal appearance, no friability, thin discharge. White string from IUD coming from OS visualized. Bimanual exam: Uterus anteverted, no adnexal masses. No cervical motion tenderness.  Assessment & Plan:

## 2012-04-17 NOTE — Telephone Encounter (Signed)
Pt just came and will like to renew OC.  [facility]ines

## 2012-04-17 NOTE — Patient Instructions (Signed)
Dolor plvico  (Pelvic Pain)  El dolor plvico se siente debajo del ombligo y a nivel de las caderas. Puede tener diferentes causas. Es importante recibir asistencia inmediatamente. Especialmente en los casos de dolor agudo, intenso e inusual que aparece de [place] sbita.  CUIDADOS EN EL HOGAR   Slo tome los medicamentos segn le indique el mdico.  Haga reposo tal como le indic el mdico.  Consuma una dieta rica en frutas, vegetales y carnes [place].  Beba gran cantidad de lquido para mantener el pis (orina) de tono claro o amarillo plido.  Evite las relaciones sexuales, Counsellor.  Aplique compresas calientes o fras en la zona baja del vientre (abdomen). Aplique la compresa que Conservation officer, nature.  Evite las situaciones que le causen estrs.  Lleve un registro Programmer, systems. Marcelino Freestone:  Cundo comenz el dolor.  Dnde se localiza.  Las cosas que parecen relacionarse con el dolor, como las comidas o el perodo menstrual.  Concurra a las consultas de control con el mdico, segn las indicaciones. SOLICITE AYUDA DE INMEDIATO SI:   Tiene una hemorragia por la vagina.  Siente ms dolor plvico.  Se siente dbil, tiene mareos o se desvanece (se desmaya).  Siente escalofros.  Siente dolor al orinar u observa sangre en la orina.  La materia fecal es lquida (diarrea).  No puede detener los vmitos.  Tiene fiebre o sntomas que persisten durante ms de [address]  [address].  Tiene fiebre y los sntomas [address].  Ha sido abusada fsica o sexualmente.  Los medicamentos no Editor, commissioning.  Observa lquido (secrecin) por la vagina o un lquido que no es normal. ASEGRESE DE QUE:   Comprende estas instrucciones.  Controlar su enfermedad.  Solicitar ayuda de inmediato si no mejora o si empeora. Document Released: 12/04/2011 [facility] Patient Information 2013 [place], [place].

## 2012-09-08 ENCOUNTER — Ambulatory Visit (INDEPENDENT_AMBULATORY_CARE_PROVIDER_SITE_OTHER): Payer: No Typology Code available for payment source | Admitting: Family Medicine

## 2012-09-08 VITALS — BP 99/60 | HR 68 | Temp 98.1°F | Ht 63.0 in | Wt 143.4 lb

## 2012-09-08 DIAGNOSIS — R21 Rash and other nonspecific skin eruption: Secondary | ICD-10-CM

## 2012-09-08 DIAGNOSIS — K429 Umbilical hernia without obstruction or gangrene: Secondary | ICD-10-CM

## 2012-09-08 MED ORDER — HYDROCORTISONE 0.5 % EX CREA: TOPICAL_CREAM | Freq: Two times a day (BID) | CUTANEOUS | Status: DC

## 2012-09-08 NOTE — Assessment & Plan Note (Signed)
Suspect this is contact dermatitis of some sort- discussed using hypo allogenic products.  Rx hydrocortisone 5% cream to apply BID.

## 2012-09-08 NOTE — Progress Notes (Signed)
  Subjective:    Patient ID: Alexandria Casey, female    DOB: [birth year], 37 y.o.   MRN: [id number]  HPI  Ron comes in with a few complaints.    Face rash: has been present for two years since last baby was born.  Is very dry and itchy.  She denies any changes in lotions, soaps, make up.  She uses lubrium on it which helps a little.  Is mostly on the sides of her face.   Abdominal discomfort: also present since last delivery.  Some crampy pain and a bump at the umbilicus from time to time.  No nausea, vomiting, diarrhea.   Review of Systems Pertinent items in HPI    Objective:   Physical Exam BP 99/60  Pulse 68  Temp(Src) 98.1 F (36.7 C) (Oral)  Ht 5\' 3"  (1.6 m)  Wt 143 lb 6.4 oz (65.046 kg)  BMI 25.41 kg/m2 General appearance: alert, cooperative and no distress Face: fine minimally raised pink bumps on lateral edge of cheeks, forehead Abd: + BS NT/ND, there is a small.5cm palpable defect in internal abdominal wall at umbilicus.       Assessment & Plan:

## 2012-09-08 NOTE — Assessment & Plan Note (Signed)
Very small but do suspect umbilical hernia- discussed natural progression of hernia and reviewed signs to seek emergency care.  Advised may need to have surgery which she is not interested in right now.

## 2012-09-08 NOTE — Progress Notes (Signed)
Interpreter Hasina Kreager Namihira for Dr Chambelain 

## 2012-09-08 NOTE — Patient Instructions (Signed)
It was nice to meet you.  I think your dry itchy skin is a skin allergy of some sort.  Please try the hydrocortisone cream twice a day for a week, then as needed.   Please try to use only hypo-allogenic products without scent or dyes.   Fue Curator. Creo que tu piel seca es una alergia en la piel de algn tipo. Por favor, intenta que la crema de [employer] veces al da durante una semana, a continuacin, segn sea necesario. Por favor, trate de usar productos slo hipo-alognico sin olor o colorantes.

## 2013-01-13 ENCOUNTER — Ambulatory Visit: Payer: Self-pay | Admitting: Family Medicine

## 2013-01-29 ENCOUNTER — Encounter: Payer: Self-pay | Admitting: Family Medicine

## 2013-01-29 ENCOUNTER — Ambulatory Visit (INDEPENDENT_AMBULATORY_CARE_PROVIDER_SITE_OTHER): Payer: No Typology Code available for payment source | Admitting: Family Medicine

## 2013-01-29 VITALS — BP 108/47 | HR 71 | Temp 98.2°F | Ht 63.0 in | Wt 146.0 lb

## 2013-01-29 DIAGNOSIS — D239 Other benign neoplasm of skin, unspecified: Secondary | ICD-10-CM

## 2013-01-29 DIAGNOSIS — D229 Melanocytic nevi, unspecified: Secondary | ICD-10-CM

## 2013-01-29 MED ORDER — PREDNISONE 20 MG PO TABS: 20.0000 mg | ORAL_TABLET | Freq: Every day | ORAL | Status: DC

## 2013-01-29 NOTE — Progress Notes (Signed)
Family Medicine Office Visit Note   Subjective:   Patient ID: Alexandria Casey, female  DOB: March 18, 1976, 37 y.o.. MRN: [id number]   Pt that comes today complaining of several nevi in her face that have increased in size for the past two months. She reports has been having this generalized "skin problem" for about 2 years where her whole body itches. It is worse in her face but present on arms, legs and abdomen. She has ben treated with topical hydrocortisone but is not longer using it since it makes her skin worse. She reports has change her diet to try to eat more healthy but has not noticed any improvement either. Her face's nevi are present on forehead and on her left cheek. She reports they also itch and after scratching they become more inflamed and then they hurt.   Of note she also reports bloating and constipation for the same amount of time. Denies blood in stools or dyspepsia. Reports occasional heartburn that resolves with OTC antiacids.   Past medical history and family history is non contributory  Review of Systems:  Per HPI  Objective:   Physical Exam: Gen:  NAD HEENT: Moist mucous membranes. Skin: Face: generalized maculopapular erythematous rash ( arms, legs and face. Spares palms and soles.  Nevi present on forehead # of 3 total measure 0.2 cm each. Base is rased but no erythematous. Normal coloration. Nevi on her left cheek: 0.3 cm with raised base, non erythematous more dark in color but not very different from the rest of her nevi. Pt has other nevi present on face (including her chin) and on her neck. Her are bigger but she denies recent changes on them.  CV: Regular rate and rhythm, no murmurs rubs or gallops PULM: Clear to auscultation bilaterally. No wheezes/rales/rhonchi ABD: Soft, non tender, non distended, normal bowel sounds.  Assessment & Plan:

## 2013-01-29 NOTE — Patient Instructions (Addendum)
Tiene una erupcion en la piel que parece alergica. Trate de hacer dieta libre de GLUTEN.  Tome la medicina como se le ha indicado y vuelva en un mes para evaluar sus lunares.

## 2013-01-29 NOTE — Assessment & Plan Note (Signed)
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 Seems constitutional and the ones pointed to [name] seems don't seem to be concerning from malignancy. (they are regular, normal pigmentation) they may be altered from pt's baseline generalized rash.  P/ Prednisone 20 mg daily for 5 days and instructed pt to try to find possible environmental cause.  Concerning her GI associated symptom. So also instructed Gluten free diet. Food diary and f/u in 1 month to evaluate her condition again. Discussed signs of worsening condition that should prompt re-evaluation.

## 2013-06-24 ENCOUNTER — Ambulatory Visit: Payer: No Typology Code available for payment source | Admitting: Family Medicine

## 2013-12-07 ENCOUNTER — Encounter: Payer: No Typology Code available for payment source | Admitting: Family Medicine

## 2013-12-21 ENCOUNTER — Encounter: Payer: No Typology Code available for payment source | Admitting: Family Medicine

## 2014-01-25 ENCOUNTER — Ambulatory Visit: Payer: No Typology Code available for payment source

## 2014-02-09 ENCOUNTER — Telehealth: Payer: Self-pay | Admitting: Clinical

## 2014-02-09 NOTE — Telephone Encounter (Signed)
Pt has an appt scheduled for 02-23-14 with Dr. Sherril Cong.  Jazmin Hartsell,CMA

## 2014-02-09 NOTE — Telephone Encounter (Signed)
CSW received an email from [facility] stating that the patient needs a dermatology and eye exam referral. The pt also states she has a rash on her face and nose that she states she has had a problem with for 2 years.   CSW left a message for [place] with [facility] informing him that pt has not been to the clinic since 2014 and will need an appt before any referrals can be made. CSW will contact pt appt time once it has been scheduled for her.  Hunt Oris, MSW, [place]

## 2014-02-11 ENCOUNTER — Telehealth: Payer: Self-pay | Admitting: Clinical

## 2014-02-11 NOTE — Telephone Encounter (Signed)
CSW informed by Shawna Orleans with [facility] that pt has been contacted about her appointment next week. Pt's updated contact number is [phone], CSW has updated pt demographics.  Hunt Oris, MSW, [place]

## 2014-02-23 ENCOUNTER — Encounter: Payer: Self-pay | Admitting: Family Medicine

## 2014-02-23 ENCOUNTER — Ambulatory Visit (INDEPENDENT_AMBULATORY_CARE_PROVIDER_SITE_OTHER): Payer: No Typology Code available for payment source | Admitting: Family Medicine

## 2014-02-23 VITALS — BP 104/49 | HR 68 | Temp 98.4°F | Wt 144.0 lb

## 2014-02-23 DIAGNOSIS — Z23 Encounter for immunization: Secondary | ICD-10-CM

## 2014-02-23 DIAGNOSIS — K5900 Constipation, unspecified: Secondary | ICD-10-CM | POA: Insufficient documentation

## 2014-02-23 DIAGNOSIS — R21 Rash and other nonspecific skin eruption: Secondary | ICD-10-CM

## 2014-02-23 MED ORDER — POLYETHYLENE GLYCOL 3350 17 GM/SCOOP PO POWD: 17.0000 g | Freq: Every day | ORAL | Status: DC | PRN

## 2014-02-23 NOTE — Patient Instructions (Addendum)
Para su cara, quiero mandarle a un dermatologo porque los tratamientos que usabamos no han funcionado.  Para su estrenimiento, he recetado un medicamento que se llama miralax. Es un medicamento muy seguro y [employer] o tres dosis al dia si necesita.

## 2014-03-07 NOTE — Assessment & Plan Note (Addendum)
Pt reports chronic constipation with bloating and occasional nausea - miralax - f/u in 1 month

## 2014-03-07 NOTE — Assessment & Plan Note (Signed)
Diffuse, rough erythematous over all of face and parts of neck, multiple trials of topical steroids have been unsuccessful, very dry and patient reports applying moisturizers 4-5 times per day, burning - refer to dermatology, will likely need biopsy

## 2014-03-07 NOTE — Progress Notes (Signed)
   Subjective:    Patient ID: Alexandria Casey, female    DOB: 07-Apr-1976, 38 y.o.   MRN: [id number]  HPI Pt presents for eval of facial rash that has been chronic. She reports that it burns and feels tight and dry. It has been present and gradually worsening for several years. She has been given several types of topical steroids that do not appear to make a difference. She applies moisturizer to her face 4-5 times per day because it is so uncomfortable and that is the only thing that seems to help at all. She has switched to all hypoallergenic face products but that has not helped. She would like to be referred to a dermatologist.  Pt also complains of several months of constipation with bloating and nausea. Denies vomiting and abdominal pain. Has tried to increase fluids and fiber but these don't seem to have helped.   Review of Systems See HPI    Objective:   Physical Exam  Nursing note and vitals reviewed. Constitutional: She is oriented to person, place, and time. She appears well-developed and well-nourished. No distress.  HENT:  Head: Normocephalic and atraumatic.  Eyes: Conjunctivae are normal. Right eye exhibits no discharge. Left eye exhibits no discharge. No scleral icterus.  Cardiovascular: Normal rate, regular rhythm and normal heart sounds.   No murmur heard. Pulmonary/Chest: Effort normal and breath sounds normal. No respiratory distress. She has no wheezes.  Abdominal: Soft. Bowel sounds are normal. She exhibits no distension and no mass. There is no rebound and no guarding.  Diffuse fullness consistent with high stool burden and diffuse discomfort with palpation but not true tenderness  Neurological: She is alert and oriented to person, place, and time.  Skin: Skin is warm and dry. Rash noted. She is not diaphoretic. There is erythema.  Diffuse rough erythema covering all of face and some of neck  Psychiatric: She has a normal mood and affect. Her behavior is  normal.          Assessment & Plan:

## 2014-03-22 ENCOUNTER — Other Ambulatory Visit (HOSPITAL_COMMUNITY)
Admission: RE | Admit: 2014-03-22 | Discharge: 2014-03-22 | Disposition: A | Discharge status: Home / Self Care | Payer: No Typology Code available for payment source | Source: Ambulatory Visit | Attending: Family Medicine | Admitting: Family Medicine

## 2014-03-22 ENCOUNTER — Encounter: Payer: Self-pay | Admitting: Family Medicine

## 2014-03-22 ENCOUNTER — Ambulatory Visit (INDEPENDENT_AMBULATORY_CARE_PROVIDER_SITE_OTHER): Payer: No Typology Code available for payment source | Admitting: Family Medicine

## 2014-03-22 VITALS — BP 98/59 | HR 72 | Temp 98.4°F | Ht 63.0 in | Wt 144.0 lb

## 2014-03-22 DIAGNOSIS — R5382 Chronic fatigue, unspecified: Secondary | ICD-10-CM

## 2014-03-22 DIAGNOSIS — Z113 Encounter for screening for infections with a predominantly sexual mode of transmission: Secondary | ICD-10-CM | POA: Insufficient documentation

## 2014-03-22 DIAGNOSIS — R102 Pelvic and perineal pain: Secondary | ICD-10-CM

## 2014-03-22 DIAGNOSIS — Z1151 Encounter for screening for human papillomavirus (HPV): Secondary | ICD-10-CM | POA: Insufficient documentation

## 2014-03-22 DIAGNOSIS — Z124 Encounter for screening for malignant neoplasm of cervix: Secondary | ICD-10-CM

## 2014-03-22 DIAGNOSIS — Z01419 Encounter for gynecological examination (general) (routine) without abnormal findings: Secondary | ICD-10-CM | POA: Insufficient documentation

## 2014-03-22 LAB — CBC
HEMATOCRIT: 39.9 % (ref 36.0–46.0)
HEMOGLOBIN: 13.6 g/dL (ref 12.0–15.0)
MCH: 28.3 pg (ref 26.0–34.0)
MCHC: 34.1 g/dL (ref 30.0–36.0)
MCV: 83.1 fL (ref 78.0–100.0)
Platelets: 310 10*3/uL (ref 150–400)
RBC: 4.8 MIL/uL (ref 3.87–5.11)
RDW: 13.1 % (ref 11.5–15.5)
WBC: 6.4 10*3/uL (ref 4.0–10.5)

## 2014-03-22 MED ORDER — NORGESTIMATE-ETH ESTRADIOL 0.25-35 MG-MCG PO TABS: 1.0000 | ORAL_TABLET | Freq: Every day | ORAL | Status: DC

## 2014-03-22 NOTE — Patient Instructions (Signed)
Algien de nuestra officina va a llamar con los resultados y la cita con [place].

## 2014-03-23 LAB — COMPREHENSIVE METABOLIC PANEL
ALBUMIN: 4.6 g/dL (ref 3.5–5.2)
ALT: 8 U/L (ref 0–35)
AST: 14 U/L (ref 0–37)
Alkaline Phosphatase: 57 U/L (ref 39–117)
BUN: 14 mg/dL (ref 6–23)
CALCIUM: 9.7 mg/dL (ref 8.4–10.5)
CHLORIDE: 102 meq/L (ref 96–112)
CO2: 26 mEq/L (ref 19–32)
Creat: 0.76 mg/dL (ref 0.50–1.10)
GLUCOSE: 74 mg/dL (ref 70–99)
POTASSIUM: 4.3 meq/L (ref 3.5–5.3)
Sodium: 139 mEq/L (ref 135–145)
Total Bilirubin: 0.3 mg/dL (ref 0.2–1.2)
Total Protein: 7.6 g/dL (ref 6.0–8.3)

## 2014-03-23 LAB — RPR

## 2014-03-23 LAB — CERVICOVAGINAL ANCILLARY ONLY
Chlamydia: NEGATIVE
NEISSERIA GONORRHEA: NEGATIVE

## 2014-03-23 LAB — CYTOLOGY - PAP

## 2014-03-23 LAB — HIV ANTIBODY (ROUTINE TESTING W REFLEX): HIV 1&2 Ab, 4th Generation: NONREACTIVE

## 2014-03-31 ENCOUNTER — Encounter: Payer: Self-pay | Admitting: *Deleted

## 2014-04-04 NOTE — Progress Notes (Signed)
   Subjective:    Patient ID: Alexandria Casey, female    DOB: 1976/04/28, 38 y.o.   MRN: [id number]  Gynecologic Exam   Pt presents for pap smear and STD screening. She denies pelvic pain, discharge, itching, sores. Sexually active with 1 female partner, thinks he is monogamous too but says "with men you never know." Last pap in September 2012, never had an abnormal per patient.   Review of Systems See HPI    Objective:   Physical Exam  Nursing note and vitals reviewed. Constitutional: She is oriented to person, place, and time. She appears well-developed and well-nourished. No distress.  HENT:  Head: Normocephalic and atraumatic.  Eyes: Conjunctivae are normal. Right eye exhibits no discharge. Left eye exhibits no discharge. No scleral icterus.  Cardiovascular: Normal rate.   Pulmonary/Chest: Effort normal.  Abdominal: Soft. She exhibits no distension and no mass. There is no tenderness. There is no rebound and no guarding.  Genitourinary: Vagina normal and uterus normal. No labial fusion. There is no rash, tenderness, lesion or injury on the right labia. There is no rash, tenderness, lesion or injury on the left labia. Uterus is not deviated, not enlarged, not fixed and not tender. Cervix exhibits no motion tenderness, no discharge and no friability. Right adnexum displays no mass, no tenderness and no fullness. Left adnexum displays no mass, no tenderness and no fullness. No erythema, tenderness or bleeding around the vagina. No foreign body around the vagina. No signs of injury around the vagina. No vaginal discharge found.    Neurological: She is alert and oriented to person, place, and time.  Skin: Skin is warm and dry. No rash noted. She is not diaphoretic.  Psychiatric: She has a normal mood and affect. Her behavior is normal.          Assessment & Plan:

## 2014-05-18 ENCOUNTER — Encounter: Payer: Self-pay | Admitting: Family Medicine

## 2014-05-18 ENCOUNTER — Ambulatory Visit (INDEPENDENT_AMBULATORY_CARE_PROVIDER_SITE_OTHER): Payer: No Typology Code available for payment source | Admitting: Family Medicine

## 2014-05-18 VITALS — BP 105/68 | HR 70 | Temp 97.7°F | Ht 63.0 in | Wt 144.0 lb

## 2014-05-18 DIAGNOSIS — L500 Allergic urticaria: Secondary | ICD-10-CM

## 2014-05-18 MED ORDER — HYDROXYZINE HCL 10 MG PO TABS: 10.0000 mg | ORAL_TABLET | Freq: Three times a day (TID) | ORAL | Status: DC | PRN

## 2014-05-18 NOTE — Progress Notes (Signed)
Patient ID: Alexandria Casey, female   DOB: 1975-08-25, 38 y.o.   MRN: [id number] Subjective:   CC: Rash  HPI:   Patient presents to sameday clinic for [place].  Had rash for 6 days. Location: 5-6 days ago, started on right neck. Red circles that were not raised. 4 days ago, had some on her waist line and neck area had calmed to mild redness. 3 days ago, on chest, neck, and back 2 days ago, just a little on neck and belt Yesterday, all of trunk, all of arm, buttocks. Raised.  Medications tried: No Similar rash in past: Yes - when want to exercise or in the cold, she has had redness and itching/burning but nothing like the raised rashes New medications or antibiotics: 1 month new medication (metronidazole lotion 0.75% and doxycycline monoh 100mg  cap BID) from dermatologist for dry/flaky skin on face. Causes some itching. She thinks it's the pill because she just puts the cream on her face. Tick, Insect or new pet exposure: No Recent travel: No New detergent or soap: No Immunocompromised: No  Symptoms Itching: Yes Pain over rash: Yes Feeling ill all over: Tired and not feeling like she wants to move Fever: No Mouth sores: No Face or tongue swelling: Just in chin, ~1.5 months Trouble breathing: No Joint swelling or pain: Sometimes pain of joints, worse in the past week  Review of Symptoms - see HPI; denies symptoms in her mouth, vagina, anus. SH - Smoking status noted.   PMH - constipation, rash on face, GERD, pelvic pain in female Meds - reviewed: prednisone 20mg  daily, zantac, ibuprofen, hydrocortisone cream 0.5%.     Objective:  Physical Exam BP 105/68 mmHg  Pulse 70  Temp(Src) 97.7 F (36.5 C) (Oral)  Ht 5\' 3"  (1.6 m)  Wt 144 lb (65.318 kg)  BMI 25.51 kg/m2 GEN: NAD, pleasant SKIN: Urticarial rash (raised, erythematous, central clearing) on right extensor forearm in 5cm area, bilateral inner thighs in 8cm area, left upper arm 2cm diameter, and left elbow 2cm  diameter with no induration and with blanching present HEENT: O/p clear with no swelling or rash PULM: Normal effort with no stridor or wheeze heard   Assessment:     Alexandria Casey is a 38 y.o. female here for rash all over body.    Plan:     # See problem list and after visit summary for problem-specific plans.   # Health Maintenance: Not discussed today.  Follow-up: Follow up in 1 week if not improving.   Hilton Sinclair, MD [place]

## 2014-05-18 NOTE — Patient Instructions (Addendum)
Good to see you.  Stop taking doxycycline as this likely caused you to have itching and hives. This should get better in a few days but if it does not, follow back up with [name] in 1 week. If it gets worse or you have face/tongue/lip swelling or trouble breathing, call 911 / seek immediate care. Call your dermatologist for other options for your skin, letting them know about the allergy. Take atarax 3 times daily as needed for severe itching. It can make you drowsy. If it is too costly, you can try benadryl instead which may also cause drowsiness.  Best, Hilton Sinclair, MD

## 2014-05-20 DIAGNOSIS — L500 Allergic urticaria: Secondary | ICD-10-CM | POA: Insufficient documentation

## 2014-05-20 NOTE — Assessment & Plan Note (Signed)
Likely caused by doxycycline given timecourse of this recently being started, though somewhat peculiar  That it began 3 weeks after start of medication. - Discontinue doxy and call derm for other recommendation for tx of facial rash (?rosacea). Added doxy to list of allergies. - Reassured that should improve within days. F/u if not improving in 1 week. - Seek immediate care if any facial/mouth swelling or dyspnea. - Atarax or benadryl PRN severe itching. - Precepted and examined with Dr McDiarmid.

## 2014-06-28 ENCOUNTER — Ambulatory Visit: Payer: Self-pay

## 2014-11-09 ENCOUNTER — Other Ambulatory Visit (HOSPITAL_COMMUNITY)
Admission: RE | Admit: 2014-11-09 | Discharge: 2014-11-09 | Disposition: A | Discharge status: Home / Self Care | Payer: Self-pay | Source: Ambulatory Visit | Attending: Family Medicine | Admitting: Family Medicine

## 2014-11-09 ENCOUNTER — Encounter: Payer: Self-pay | Admitting: Family Medicine

## 2014-11-09 ENCOUNTER — Ambulatory Visit (INDEPENDENT_AMBULATORY_CARE_PROVIDER_SITE_OTHER): Payer: Self-pay | Admitting: Family Medicine

## 2014-11-09 VITALS — BP 108/50 | HR 68 | Temp 98.2°F | Ht 63.0 in | Wt 150.5 lb

## 2014-11-09 DIAGNOSIS — R102 Pelvic and perineal pain: Secondary | ICD-10-CM

## 2014-11-09 DIAGNOSIS — H1012 Acute atopic conjunctivitis, left eye: Secondary | ICD-10-CM

## 2014-11-09 DIAGNOSIS — Z113 Encounter for screening for infections with a predominantly sexual mode of transmission: Secondary | ICD-10-CM

## 2014-11-09 DIAGNOSIS — K5900 Constipation, unspecified: Secondary | ICD-10-CM

## 2014-11-09 LAB — POCT URINE PREGNANCY: Preg Test, Ur: NEGATIVE

## 2014-11-09 LAB — POCT WET PREP (WET MOUNT): Clue Cells Wet Prep Whiff POC: NEGATIVE

## 2014-11-09 LAB — POCT URINALYSIS DIPSTICK
Bilirubin, UA: NEGATIVE
Glucose, UA: NEGATIVE
KETONES UA: NEGATIVE
LEUKOCYTES UA: NEGATIVE
NITRITE UA: NEGATIVE
PH UA: 7
PROTEIN UA: NEGATIVE
SPEC GRAV UA: 1.01
UROBILINOGEN UA: 0.2

## 2014-11-09 LAB — POCT UA - MICROSCOPIC ONLY

## 2014-11-09 MED ORDER — FLUTICASONE PROPIONATE 50 MCG/ACT NA SUSP: 2.0000 | Freq: Every day | NASAL | Status: DC

## 2014-11-09 MED ORDER — POLYETHYLENE GLYCOL 3350 17 GM/SCOOP PO POWD: 17.0000 g | Freq: Every day | ORAL | Status: DC | PRN

## 2014-11-09 MED ORDER — DOCUSATE SODIUM 100 MG PO CAPS: 100.0000 mg | ORAL_CAPSULE | Freq: Two times a day (BID) | ORAL | Status: DC

## 2014-11-09 NOTE — Patient Instructions (Signed)
We are doing labwork today and I will call with results. Once I see these results, we can move forward with ultrasound if needed. Seek immediate care if you have severe worsening of pain, fevers or chills. Otherwise, plan to follow up in 2-3 weeks. Take miralax every day 2-3 times daily along with colace until having daily soft stools.  Also take flonase every day.   Best,  Hilton Sinclair, MD

## 2014-11-09 NOTE — Progress Notes (Signed)
Patient ID: Alexandria Casey, female   DOB: 09-Jan-1976, 39 y.o.   MRN: [id number] Subjective:   CC: Abdominal pain and pink eye  HPI:   Patient interviewed in [place] with aid of interpreter Graciela after initially using [employer] ID# [id number].  Samed[place] for stomach pain and pink eye.   Stomach pain RLQ abdominal aching for 4 years, worse over the past 2 months, now 8/10 on pain scale. For the past 3 weeks it has felt severe. Starts across low back and radiates to abdomen, feeling like contractions/cramps. Symptoms start 1 week before period and again 1 week after period, lasting 1 week each. Much more gas than usual and feels constipated (very hard stool every 1-2 days) despite using miralax 2-3 times daily for a few weeks in the past, not currently. Has had nausea that she thinks is anxiety related during pain episodes, chills, and bloody yellowish vaginal discharge for 1 month the week after her periods in the last 2 months. Denies dyspareunia, bloody stool, vomiting, dysuria, fever, or weight loss. Ibuprofen and tylenol improve symptoms for 2 hours. She has reportedly tried OCPs which did not help. She wonders if her mirena is the cause. She has normal periods with LMP 10/23/14. No concern for STDs.  Pink eye States she has chronic left eye redness "for years" with increased tearing and itching, but with NO pain, difficulty with eye movement, fevers, discharge, swelling, or vision change. She has tried allergy and antibiotic eye drops which have not helped.  Review of Systems - Per HPI.   PMH - GERD, fatigue, pelvic pain in female, rash on face, umbilical hernia, multiple nevi, constipation, allergic urticaria    Objective:  Physical Exam BP 108/50 mmHg  Pulse 68  Temp(Src) 98.2 F (36.8 C) (Oral)  Ht 5\' 3"  (1.6 m)  Wt 150 lb 8 oz (68.266 kg)  BMI 26.67 kg/m2 GEN: NAD CV: RRR, no m/r/g PULM: CTAB, normal effort ABD: S/ND/NABS, no organomegaly, tender RLQ and  suprapubic moderately GU: External genitalia normal in appearance, cervix with 1cm of IUD strings visualized from os, thick clearish-yellow discharge, no CMT, uterus and adnexae palpated with normal mobility and no masses; RLQ/pelvis TTP HEENT: AT/[place], right sclera clear, left mildly erythematous with pterygium, EOMI, no purulence or bulging SKIN: No rash or cyanosis  Assessment:     Alexandria Casey is a 39 y.o. female here for abdominal pain and eye redness.    Plan:     # See problem list and after visit summary for problem-specific plans.   # Health Maintenance: Not discussed  Follow-up: Follow up in 2-3 weeks for follow up of abdominal pain/stools and eye redness.Hilton Sinclair, MD [place]

## 2014-11-10 LAB — GC/CHLAMYDIA PROBE AMP (~~LOC~~) NOT AT ARMC
Chlamydia: NEGATIVE
NEISSERIA GONORRHEA: NEGATIVE

## 2014-11-11 ENCOUNTER — Telehealth: Payer: Self-pay | Admitting: Family Medicine

## 2014-11-11 DIAGNOSIS — K5900 Constipation, unspecified: Secondary | ICD-10-CM

## 2014-11-11 DIAGNOSIS — H1010 Acute atopic conjunctivitis, unspecified eye: Secondary | ICD-10-CM | POA: Insufficient documentation

## 2014-11-11 MED ORDER — POLYETHYLENE GLYCOL 3350 17 GM/SCOOP PO POWD: 17.0000 g | Freq: Every day | ORAL | Status: DC

## 2014-11-11 NOTE — Assessment & Plan Note (Signed)
Left conjunctival erythema and itching, likely allergic conjunctivitis. Odd that it is unilateral, but no signs/symptoms of bacterial infection. -flonase DAILY -f/u in 2-3 weeks. Can add antihistamine eye drop if needed.

## 2014-11-11 NOTE — Assessment & Plan Note (Signed)
Chronic with subacute worsening. Symptoms consistent with constipation (which she has dealt with for some time) vs pelvic pain from infection with cervical discharge. No dysuria but some back pain associated. No fevers, cervical motion tenderness, adnexal mass, or dyspareunia. IUD in place. -UA, UPT, wet prep, GC/chlamydia -If all testing normal, consider pelvic US -Miralax 2-3 times daily, colace BID until regular soft stools -Discussed that we should pursue this workup and daily soft BMs prior to removing mirena as cause is likely NOT mirena, but of course can remove at any time if pt insisted. Pt is agreeable with this plan. -F/u 2-3 weeks or sooner PRN fever, worsening symptoms, nausea/vomiting.

## 2014-11-11 NOTE — Telephone Encounter (Signed)
Called to notify patient in spanish that her labwork (wet prep, GC/chlamydia, Upreg, UA) was all normal. She stated she was feeling a little better but was unable to get the miralax at [facility] due to no rx. I informed her it is over-the-counter but that I had nevertheless already sent 2 days ago. Will try to send again today.  - She should take miralax daily, increasing frequency until stooling regularly with doses of 3-4 times daily okay initially if needed.  - She opted for f/u in 2-3 weeks and proceed with [name] at that time if symptoms not improving with regular BM. - on follow up, consider f/u of UA with small blood (occ RBC).  Hilton Sinclair, MD

## 2014-12-01 ENCOUNTER — Encounter: Payer: Self-pay | Admitting: Family Medicine

## 2014-12-01 ENCOUNTER — Ambulatory Visit (INDEPENDENT_AMBULATORY_CARE_PROVIDER_SITE_OTHER): Payer: Self-pay | Admitting: Family Medicine

## 2014-12-01 VITALS — BP 115/48 | HR 80 | Temp 98.2°F | Wt 149.3 lb

## 2014-12-01 DIAGNOSIS — H1012 Acute atopic conjunctivitis, left eye: Secondary | ICD-10-CM

## 2014-12-01 DIAGNOSIS — R102 Pelvic and perineal pain: Secondary | ICD-10-CM

## 2014-12-01 LAB — POCT URINE PREGNANCY: PREG TEST UR: NEGATIVE

## 2014-12-01 MED ORDER — OLOPATADINE HCL 0.1 % OP SOLN: 1.0000 [drp] | Freq: Two times a day (BID) | OPHTHALMIC | Status: DC

## 2014-12-01 MED ORDER — SIMETHICONE 180 MG PO CAPS: 180.0000 mg | ORAL_CAPSULE | Freq: Three times a day (TID) | ORAL | Status: DC | PRN

## 2014-12-01 NOTE — Patient Instructions (Signed)
Continua Miralax. Trata simethicone para gas. No toma mas que 2400mg  ibuprofena en un dia. Si puede, no toma lo diario. Vamos obtener ultrasonido pelvico y llamo si esta Anormal. Regresa si no mejora en 2-3 semanas para visito con Dra Adamo. Estoy haciendo referral para pelvic PT.  [place] antihistamina gotas diario en su ojo izquierda.  Gevena Mart,  Hilton Sinclair, MD

## 2014-12-01 NOTE — Progress Notes (Signed)
Patient ID: Alexandria Casey, female   DOB: 02-Oct-1975, 39 y.o.   MRN: [id number] Subjective:   CC: Abdominal pain  HPI:   Abdominal pain SDA appt to f/u abdominal pain. Last visit, we thought constipation contributing. UA, upreg, GC/chlamydia and wet prep were normal and with miralax, she is now stooling daily with some improvement in generalized pain. But RLQ pain did not improve, along with "inflammation" that are 8/10 and only improve after passing gas. They have now been occurring for 2-3 months every time she has period and ovulates. It is so bad that she is in bed 2-3 days. She has slight bleeding with sex. Pain radiates to back. With sex, symptoms worsen. Pain sometimes radiates into low back and right leg with some numbness. She occasionally feels nauseated but never has vomited. She denies hematochezia. She does feel more gassy. Denies fevers though when in pain has occasional chills. Has regular periods every 28th day of the month. Denies vaginal discharge. Denies change in PO or relationship of pain to food. Naproxen helps symptoms.   Left eye redness Had thought that this was conjunctival erythema and itching from allergic conjunctivitis of the left eye and had prescribed daily Flonase the plan to follow-up. She states symptoms only improved slightly.  Review of Systems - Per HPI.   PMH-GERD, pelvic pain in female, umbilical hernia, constipation    Objective:  Physical Exam BP 115/48 mmHg  Pulse 80  Temp(Src) 98.2 F (36.8 C) (Oral)  Wt 149 lb 5 oz (67.728 kg)  LMP 11/13/2014 GEN: NAD, pleasant CV: RRR PULM: Normal effort ABD: S/mild RLQ tenderness, mild suprapubic tenderness, ND, NABS, no organomegaly, no rebound or guarding EXTR: No LE edema HEENT: AT/[place], sclera on left with mild erythema, right clear; EOMI, no purulence or swelling    Assessment:     Alexandria Casey is a 39 y.o. female here for f/u abdominal pain and eye redness.    Plan:     #  See problem list and after visit summary for problem-specific plans.  # Health Maintenance: Not discussed  Follow-up: Follow up in 2-3 weeks for f/u abdominal pain and eye redness.    Alexandria Sinclair, MD [place]

## 2014-12-03 NOTE — Assessment & Plan Note (Signed)
RLQ abdominal/pelvic pain somewhat improved with regular stooling but still with symptoms consistent with trapped gas. Nonacute abdomen, normal PO, no emesis or diarrhea, no constipation, no sign of obstruction or appendicitis or ovarian torsion. -Repeat UPreg.  -Continue miralax, try simethicone for gas (prescribed), limit ibuprofen use. -TVUS and pelvic US ordered for further workup. -Referral for pelvic PT given nondescript pelvic symptoms and dyspareunia without cause on lab findings. If no improvement, consider referral to Gyn. -F/u 2-3 weeks with Dr Sherril Cong for f/u or sooner if symptoms get severe or fever. Consider repeat UA with occasional RBCs and small blood at last appointment

## 2014-12-03 NOTE — Assessment & Plan Note (Signed)
Left eye still with some redness, not severe and no purulence, swelling, or ophthalmoplegia. Still consistent with allergic conjunctivitis. -Patanol eye drops prescribed.  -F/u 2-3 weeks or immediately if fevers, eye pain, purulence, or other symptoms.

## 2014-12-06 ENCOUNTER — Telehealth: Payer: Self-pay | Admitting: Family Medicine

## 2014-12-06 NOTE — Telephone Encounter (Signed)
Patient is concerned about the ultrasound scheduled for tomorrow since her period started yesterday. Please, contact her ASAP (Spanish).

## 2014-12-06 NOTE — Telephone Encounter (Signed)
Please let her know that it is perfectly fine if she is on her period. It will not affect the ultrasound. Thanks!

## 2014-12-07 ENCOUNTER — Ambulatory Visit (HOSPITAL_COMMUNITY): Admission: RE | Admit: 2014-12-07 | Payer: Medicaid Other | Source: Ambulatory Visit

## 2014-12-15 ENCOUNTER — Ambulatory Visit (HOSPITAL_COMMUNITY)
Admission: RE | Admit: 2014-12-15 | Discharge: 2014-12-15 | Disposition: A | Discharge status: Home / Self Care | Payer: Medicaid Other | Source: Ambulatory Visit | Attending: Family Medicine | Admitting: Family Medicine

## 2014-12-15 DIAGNOSIS — R102 Pelvic and perineal pain: Secondary | ICD-10-CM | POA: Insufficient documentation

## 2014-12-16 ENCOUNTER — Ambulatory Visit (HOSPITAL_COMMUNITY): Payer: Medicaid Other

## 2014-12-16 NOTE — Telephone Encounter (Signed)
Please notify patient (Spanish speaking) that her transvaginal US showed IUD in correct position and had no explanation for her symptoms. Continued regular follow up to discuss if symptoms do not improve with our last plan would be my recommendation. Thank you,  Hilton Sinclair, MD

## 2014-12-21 NOTE — Telephone Encounter (Signed)
Called pt using [address] [place], [place] [address]) and advised pt as directed below and verbalized understanding. Lukasz Rogus, CMA.

## 2014-12-23 ENCOUNTER — Ambulatory Visit: Payer: Self-pay

## 2015-01-13 ENCOUNTER — Telehealth: Payer: Self-pay | Admitting: *Deleted

## 2015-01-13 ENCOUNTER — Ambulatory Visit (INDEPENDENT_AMBULATORY_CARE_PROVIDER_SITE_OTHER): Payer: Self-pay | Admitting: Family Medicine

## 2015-01-13 VITALS — BP 101/47 | HR 63 | Temp 98.0°F | Ht 63.0 in | Wt 147.3 lb

## 2015-01-13 DIAGNOSIS — Z30432 Encounter for removal of intrauterine contraceptive device: Secondary | ICD-10-CM

## 2015-01-13 MED ORDER — NORGESTIM-ETH ESTRAD TRIPHASIC 0.18/0.215/0.25 MG-35 MCG PO TABS: 1.0000 | ORAL_TABLET | Freq: Every day | ORAL | Status: DC

## 2015-01-13 MED ORDER — DESOGESTREL-ETHINYL ESTRADIOL 0.15-30 MG-MCG PO TABS: 1.0000 | ORAL_TABLET | Freq: Every day | ORAL | Status: DC

## 2015-01-13 NOTE — Progress Notes (Signed)
PROCEDURE NOTE: IUD removal Patient given informed consent for IUD removal. She is aware this will stop the birth control method provided by the IUD immediately. Informed consent given and signed copy in the chart.Appropriate time out taken. Patient placed in the lithotomy position and the cervix brought into view using speculum. The IUD strings were identified coming from the cervical os. These strings were grasped with ring forceps, and the IUD withdrawn gently from the uterus. There were no complications and no blood loss. Patient tolerated the procedure well.  We rx ocp for birth control. We gave her some condoms and precautins about needing a back up method for contraception during first month of use.

## 2015-01-13 NOTE — Telephone Encounter (Signed)
Spoke with [facility] and they don't carry the brand of OCP written for the patient.  Spoke with Dr. Nori Riis and she agreed to change this to trisprintec.  [facility] is aware and verbal script given over the phone. Herron Fero,CMA

## 2015-02-25 ENCOUNTER — Ambulatory Visit (INDEPENDENT_AMBULATORY_CARE_PROVIDER_SITE_OTHER): Payer: Self-pay | Admitting: Family Medicine

## 2015-02-25 VITALS — BP 116/54 | HR 80 | Temp 98.2°F | Wt 146.2 lb

## 2015-02-25 DIAGNOSIS — H5712 Ocular pain, left eye: Secondary | ICD-10-CM

## 2015-02-25 DIAGNOSIS — H538 Other visual disturbances: Secondary | ICD-10-CM

## 2015-02-25 DIAGNOSIS — H11002 Unspecified pterygium of left eye: Secondary | ICD-10-CM

## 2015-02-25 NOTE — Patient Instructions (Signed)
He colocado una remisin al oftalmlogo para usted. Ellos le llame con su primer cita disponible. Si se presenta dolor ocular severo o prdida de la visin, por favor vaya a la sala de urgencias. Por ahora, continuar gotas lubricantes oculares como bimatoprost o algo similar Microbiologist. Comprar un parche ocular para usar cuando se est en la luz del sol para reducir Conservation officer, historic buildings en los ojos.  Creo que sera mejor no darle un colirio receta, hasta que vea el mdico de los ojos. Si usted tiene [employer], por favor no dudes en llamar a la oficina al [phone]  Koleen Distance. Lajuana Ripple, DO PGY-2, [facility]  Visin borrosa (Blurred Vision) Anson Crofts ha concurrido al mdico por tener visin borrosa. Esto significa que ha perdido la capacidad de ver pequeos detalles.  CAUSAS La visin borrosa puede ser el sntoma de problemas oculares subyacentes, como:  Envejecimiento del ojo (presbiopa).  Glaucoma.  Cataratas.  Infeccin ocular.  Migraas relacionadas con el ojo.  Diabetes mellitus.  Fatiga.  Cefalea migraosa.  Presin arterial elevada.  Ruptura de la parte posterior del ojo (degeneracin macular).  Problemas causados por algunos medicamentos. La causa ms comn de la visin borrosa es la necesidad de anteojos o una nueva prescripcin. Hoy en el departamento de emergencias, no se ha encontrado la causa de su visin borrosa. SNTOMAS La visin borrosa es la prdida de la [place] visual y de los [place]. DIAGNSTICO Si continua teniendo visin borrosa, deber consultar al mdico. Si el profesional es su mdico de cabecera, podra derivarlo a un especialista.  TRATAMIENTO No ignore su visin borrosa. Asegrese de controlarse para ver si necesita otro tratamiento o derivacin. SOLICITE ATENCIN MDICA SI: No puede concurrir a Teaching laboratory technician y se le puede ayudar con una derivacin. SOLICITE ATENCIN MDICA DE INMEDIATO SI: Siente dolor intenso en el ojo, en la cabeza,  o prdida repentina de la visin. EST SEGURO QUE:   Comprende las instrucciones para el alta mdica.  Controlar su enfermedad.  Solicitar atencin mdica de inmediato segn las indicaciones. Document Released: 08/31/2008 Document Revised: 08/27/2011 [facility] Patient Information 2015 [place]. This information is not intended to replace advice given to you by your health care provider. Make sure you discuss any questions you have with your health care provider.

## 2015-02-25 NOTE — Progress Notes (Signed)
    Subjective: CC: burning in eyes HPI: Patient is a 39 y.o. female presenting to clinic today for same day. Concerns today include:  Spanish interpretation provided by [facility] interpreter [place] [phone]  1. Burning in eyes Patient reports that symptoms began 1 year ago in her left eye.  She reports that symptoms began as itching in her eye.  She was previously prescribed eye drops that have not relieved symptoms.  She has also been treated for allergies at this was thought to be an allergic conjunctivitis.  Last 3 days she has been feeling a foreign body sensation and it feels like her eye is closing on it's own.  She has been using Ketotifen opth gtts up to 4 times daily with very little relief.  Heat, sunlight makes burning worse.  Keeping eye closed makes it feel better.  She denies injury or sick contacts.  Reports pain with ocular movement and mild headache.  Artificial light does not bother her.  Denies exudate from eyes, fevers, chills, cough congestion, runny nose.  She reports that her vision seems decreased in her L eye.  She feels vision is blurry esp at trying to see long distances.  No family h/o ocular disease incl glaucoma that patient is aware of.  FamHx and MedHx reviewed.  Please see EMR.  ROS: All other systems reviewed and are negative.  Objective: Office vital signs reviewed. There were no vitals taken for this visit.  Physical Examination:  General: Awake, alert, well nourished, NAD HEENT: Normal    Neck: No masses palpated. No LAD    Eyes: PERRLA, EOMI (mild discomfort with movement of L eye), no limbic lesions, no corneal abrasions appreciated.  Pterygium appreciated at 7 o'clock position of L eye, does not cross over pupil. Conjunctiva without injection. No foreign bodies appreciated.    Throat: MMM, no erythema Skin: dry, intact, no rashes or lesions  Assessment/ Plan: 39 y.o. female with  1. Eye pain, left; Wood's lamp exam performed today.  No  evidence of foreign body or corneal abrasion.  Does not appear to be congruent with allergic conjunctivitis as it is unilateral.  Concern for L sided blepharospasm.  Cyclopentolate applied to L eye x2 gtts.  Patient with good relief with this medication.  Because I would like her to see opthalmology urgently, patient was not rx'd any eye drops at today's visit.     - Ambulatory referral to Ophthalmology - Patient instructed to wear eye protection when out in the sun - Use lubricating eye drop daily - Return precautions reviewed - Patient to seek immediate medical care if loss of vision or pain with ocular movement.  2. Blurry vision, left eye - Ambulatory referral to Ophthalmology  3. 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   - Will continue to monitor   Precepted with Attending Dr McDiarmid.  Janora Norlander, DO PGY-2, [place]

## 2015-10-12 LAB — GLUCOSE, POCT (MANUAL RESULT ENTRY): POC GLUCOSE: 75 mg/dL (ref 70–99)

## 2016-01-10 ENCOUNTER — Ambulatory Visit (INDEPENDENT_AMBULATORY_CARE_PROVIDER_SITE_OTHER): Payer: Self-pay

## 2016-01-10 ENCOUNTER — Encounter (HOSPITAL_COMMUNITY): Payer: Self-pay | Admitting: *Deleted

## 2016-01-10 ENCOUNTER — Ambulatory Visit (HOSPITAL_COMMUNITY)
Admission: EM | Admit: 2016-01-10 | Discharge: 2016-01-10 | Disposition: A | Discharge status: Home / Self Care | Payer: Self-pay | Attending: Family Medicine | Admitting: Family Medicine

## 2016-01-10 DIAGNOSIS — R109 Unspecified abdominal pain: Secondary | ICD-10-CM

## 2016-01-10 DIAGNOSIS — K2970 Gastritis, unspecified, without bleeding: Secondary | ICD-10-CM

## 2016-01-10 LAB — POCT URINALYSIS DIP (DEVICE)
BILIRUBIN URINE: NEGATIVE
Glucose, UA: NEGATIVE mg/dL
Ketones, ur: NEGATIVE mg/dL
Leukocytes, UA: NEGATIVE
Nitrite: NEGATIVE
Protein, ur: NEGATIVE mg/dL
SPECIFIC GRAVITY, URINE: 1.02 (ref 1.005–1.030)
Urobilinogen, UA: 0.2 mg/dL (ref 0.0–1.0)
pH: 6 (ref 5.0–8.0)

## 2016-01-10 MED ORDER — GI COCKTAIL ~~LOC~~
30.0000 mL | Freq: Once | ORAL | Status: AC
  Administered 2016-01-10: 30 mL via ORAL

## 2016-01-10 MED ORDER — OMEPRAZOLE 40 MG PO CPDR: 40.0000 mg | DELAYED_RELEASE_CAPSULE | Freq: Two times a day (BID) | ORAL | 0 refills | Status: DC

## 2016-01-10 MED ORDER — GI COCKTAIL ~~LOC~~
ORAL | Status: AC
  Filled 2016-01-10: qty 30

## 2016-01-10 NOTE — ED Notes (Signed)
Pt  Reports  Epigastric pain  Pain radiates  To  rlq    With  Some  Nausea     With  Onset  Of  Symptoms   For  Several  Days    denys  Any bleeding  Interpretor phone  Utilized     Pt  Advised  Npo    Pt  Sitting upright on  Exam table  Speaking  In  Complete  sentances

## 2016-01-10 NOTE — ED Provider Notes (Signed)
[facility]    CSN: [id number] Arrival date & time: 01/10/16  1521  First Provider Contact:  None       History   Chief Complaint Chief Complaint  Patient presents with  . Abdominal Pain    HPI Alexandria Casey is a 40 y.o. female.She presents today with onset of abdominal pain 3 days ago, this is intermittent, crampy. She thought it was something that she ate. She has epigastric discomfort with bloating and some nausea. No vomiting. No diarrhea. She feels constipated, feels like she has to go to the bathroom but can't go. Stool softener was not helpful. She denies dysuria, no hematuria, no unusual vaginal discharge or bleeding. No fever. Not short of breath, no cough. Ranitidine and Tums provide short-term relief. No history of abdominal surgery.   HPI  Past Medical History:  Diagnosis Date  . ABNORMAL MATERNAL GLUCOSE TOLERANCE ANTEPARTUM 10/26/2009   Qualifier: Diagnosis of  By: Martinique, Bonnie    . IUD (intrauterine device) in place 08/26/10    Patient Active Problem List   Diagnosis Date Noted  . Pterygium of left eye 02/25/2015  . Allergic conjunctivitis 11/11/2014  . Unspecified constipation 02/23/2014  . Multiple nevi 01/29/2013  . Rash of face 09/08/2012  . Umbilical hernia [id number]  . Pelvic pain in female 04/17/2012  . Fatigue 01/23/2011  . GERD 07/06/2010    History reviewed. No pertinent surgical history.     Home Medications    Prior to Admission medications   Medication Sig Start Date End Date Taking? Authorizing Provider  desogestrel-ethinyl estradiol (APRI,EMOQUETTE,SOLIA) 0.15-30 MG-MCG tablet Take 1 tablet by mouth daily. 01/13/15   Dickie La, MD  docusate sodium (COLACE) 100 MG capsule Take 1 capsule (100 mg total) by mouth 2 (two) times daily. 11/09/14   Hilton Sinclair, MD  fluticasone (FLONASE) 50 MCG/ACT nasal spray Place 2 sprays into both nostrils daily. 11/09/14   Hilton Sinclair, MD  hydrocortisone cream  0.5 % Apply topically 2 (two) times daily. 09/08/12   Cletus Gash, MD  hydrOXYzine (ATARAX/VISTARIL) 10 MG tablet Take 1 tablet (10 mg total) by mouth 3 (three) times daily as needed. 05/18/14   Hilton Sinclair, MD  ibuprofen (ADVIL,MOTRIN) 800 MG tablet Take 800 mg by mouth every 8 (eight) hours as needed.      Historical Provider, MD  levonorgestrel (MIRENA) 20 MCG/24HR IUD 1 each by Intrauterine route once.      Historical Provider, MD  norgestimate-ethinyl estradiol (ORTHO-CYCLEN,SPRINTEC,PREVIFEM) 0.25-35 MG-MCG tablet Take 1 tablet by mouth daily. 03/22/14   Frazier Richards, MD  Norgestimate-Ethinyl Estradiol Triphasic (TRI-SPRINTEC) 0.18/0.215/0.25 MG-35 MCG tablet Take 1 tablet by mouth daily. 01/13/15   Dickie La, MD  olopatadine (PATANOL) 0.1 % ophthalmic solution Place 1 drop into both eyes 2 (two) times daily. 12/01/14   Hilton Sinclair, MD  polyethylene glycol powder (GLYCOLAX/MIRALAX) powder Take 17 g by mouth daily. 11/11/14   Hilton Sinclair, MD  predniSONE (DELTASONE) 20 MG tablet Take 1 tablet (20 mg total) by mouth daily. 01/29/13   Dayarmys Pil[place] de Gwendalyn Ege, MD  ranitidine (ZANTAC) 75 MG tablet Take 1 tablet (75 mg total) by mouth daily. 01/09/12   Josalyn Funches, MD  Simethicone 180 MG CAPS Take 1 capsule (180 mg total) by mouth 3 (three) times daily as needed (gas/flatulance/bloating). 12/01/14   Hilton Sinclair, MD    Family History No family history on file.  Social History Social History  Substance Use  Topics  . Smoking status: Never Smoker  . Smokeless tobacco: Never Used  . Alcohol use No     Allergies   Doxycycline   Review of Systems Review of Systems  All other systems reviewed and are negative.    Physical Exam Triage Vital Signs ED Triage Vitals [01/10/16 1630]  Enc Vitals Group     BP (!) 105/51     Pulse Rate 74     Resp 18     Temp 97.6 F (36.4 C)     Temp Source Oral     SpO2 100 %     Weight 145 lb (65.8 kg)      Height 5\' 1"  (1.549 m)    Updated Vital Signs BP (!) 105/51 (BP Location: Left Arm)   Pulse 74   Temp 97.6 F (36.4 C) (Oral)   Resp 18   Ht 5\' 1"  (1.549 m)   Wt 145 lb (65.8 kg)   LMP 12/14/2015   SpO2 100%   BMI 27.40 kg/m  Physical Exam  Constitutional: She appears well-developed and well-nourished. No distress.  HENT:  Head: Normocephalic and atraumatic.  Eyes: Conjunctivae are normal.  Neck: Neck supple.  Cardiovascular: Normal rate and regular rhythm.   No murmur heard. Pulmonary/Chest: Effort normal and breath sounds normal. No respiratory distress.  Abdominal: Soft. She exhibits no distension. There is no rebound and no guarding.  Moderate, nonlocalizing tenderness to deep palpation in the epigastrium, right upper quadrant, the right lower quadrant, and the left lower quadrant.  Musculoskeletal: She exhibits no edema.  Neurological: She is alert.  Skin: Skin is warm and dry.  Psychiatric: She has a normal mood and affect.  Nursing note and vitals reviewed.    UC Treatments / Results    Results for orders placed or performed during the hospital encounter of 01/10/16  POCT urinalysis dip (device)  Result Value Ref Range   Glucose, UA NEGATIVE NEGATIVE mg/dL   Bilirubin Urine NEGATIVE NEGATIVE   Ketones, ur NEGATIVE NEGATIVE mg/dL   Specific Gravity, Urine 1.020 1.005 - 1.030   Hgb urine dipstick MODERATE (A) NEGATIVE   pH 6.0 5.0 - 8.0   Protein, ur NEGATIVE NEGATIVE mg/dL   Urobilinogen, UA 0.2 0.0 - 1.0 mg/dL   Nitrite NEGATIVE NEGATIVE   Leukocytes, UA NEGATIVE NEGATIVE     Radiology   CLINICAL DATA:  Epigastric pain for several days. EXAM: DG ABDOMEN ACUTE W/ 1V CHEST COMPARISON:  None. FINDINGS: There is no evidence of dilated bowel loops or free intraperitoneal air. No radiopaque calculi or other significant radiographic abnormality is seen. Heart size and mediastinal contours are within normal limits. Both lungs are  clear. IMPRESSION: Negative abdominal radiographs.  No acute cardiopulmonary disease. Electronically Signed   By: Misty Stanley M.D.   On: 01/10/2016 17:38  Procedures Procedures (including critical care time) none  Medications Ordered in UC Medications  gi cocktail (Maalox,Lidocaine,Donnatal) (30 mLs Oral Given 01/10/16 1743)     Initial Impression / Assessment and Plan / UC Course  I have reviewed the triage vital signs and the nursing notes.  Pertinent labs & imaging results that were available during my care of the patient were reviewed by me and considered in my medical decision making (see chart for details).  Clinical Course   Some improvement with GI cocktail given at [facility]. Will give trial of acid reflux tx; patient should go to ED for persistent/worsening symptoms for consideration of advanced abd/pelvic imaging for kidney stone/appendicitis/ovarian  issue.    Final Clinical Impressions(s) / UC Diagnoses   Final diagnoses:  Gastritis  Abdominal pain, unspecified abdominal location    New Prescriptions Discharge Medication List as of 01/10/2016  6:16 PM    START taking these medications   Details  omeprazole (PRILOSEC) 40 MG capsule Take 1 capsule (40 mg total) by mouth 2 (two) times daily before a meal., Starting Tue 01/10/2016, Until Tue 01/24/2016, Normal         Sherlene Shams, MD 01/24/16 [phone]

## 2016-01-10 NOTE — Discharge Instructions (Signed)
Upper abdominal discomfort seems most likely to be related to a stomach acid problem.  Prescription for omeprazole was sent to the [facility] at [facility].  If not improving, followup with pcp Alexandria Casey or go to ED for further evaluation of causes of abdominal discomfort.

## 2016-01-22 NOTE — ED Provider Notes (Signed)
CSN: [id number]     Arrival date & time 01/10/16  1521 History   None    Chief Complaint  Patient presents with  . Abdominal Pain   (Consider location/radiation/quality/duration/timing/severity/associated sxs/prior Treatment) HPI  Past Medical History:  Diagnosis Date  . ABNORMAL MATERNAL GLUCOSE TOLERANCE ANTEPARTUM 10/26/2009   Qualifier: Diagnosis of  By: Martinique, Bonnie    . IUD (intrauterine device) in place 08/26/10   History reviewed. No pertinent surgical history. No family history on file. Social History  Substance Use Topics  . Smoking status: Never Smoker  . Smokeless tobacco: Never Used  . Alcohol use No   OB History    No data available     Review of Systems  Allergies  Doxycycline  Home Medications   Prior to Admission medications   Medication Sig Start Date End Date Taking? Authorizing Provider  desogestrel-ethinyl estradiol (APRI,EMOQUETTE,SOLIA) 0.15-30 MG-MCG tablet Take 1 tablet by mouth daily. 01/13/15   Dickie La, MD  docusate sodium (COLACE) 100 MG capsule Take 1 capsule (100 mg total) by mouth 2 (two) times daily. 11/09/14   Hilton Sinclair, MD  fluticasone (FLONASE) 50 MCG/ACT nasal spray Place 2 sprays into both nostrils daily. 11/09/14   Hilton Sinclair, MD  hydrocortisone cream 0.5 % Apply topically 2 (two) times daily. 09/08/12   Cletus Gash, MD  hydrOXYzine (ATARAX/VISTARIL) 10 MG tablet Take 1 tablet (10 mg total) by mouth 3 (three) times daily as needed. 05/18/14   Hilton Sinclair, MD  ibuprofen (ADVIL,MOTRIN) 800 MG tablet Take 800 mg by mouth every 8 (eight) hours as needed.      Historical Provider, MD  levonorgestrel (MIRENA) 20 MCG/24HR IUD 1 each by Intrauterine route once.      Historical Provider, MD  norgestimate-ethinyl estradiol (ORTHO-CYCLEN,SPRINTEC,PREVIFEM) 0.25-35 MG-MCG tablet Take 1 tablet by mouth daily. 03/22/14   Frazier Richards, MD  Norgestimate-Ethinyl Estradiol Triphasic (TRI-SPRINTEC) 0.18/0.215/0.25  MG-35 MCG tablet Take 1 tablet by mouth daily. 01/13/15   Dickie La, MD  olopatadine (PATANOL) 0.1 % ophthalmic solution Place 1 drop into both eyes 2 (two) times daily. 12/01/14   Hilton Sinclair, MD  omeprazole (PRILOSEC) 40 MG capsule Take 1 capsule (40 mg total) by mouth 2 (two) times daily before a meal. 01/10/16 01/24/16  Sherlene Shams, MD  polyethylene glycol powder (GLYCOLAX/MIRALAX) powder Take 17 g by mouth daily. 11/11/14   Hilton Sinclair, MD  predniSONE (DELTASONE) 20 MG tablet Take 1 tablet (20 mg total) by mouth daily. 01/29/13   Dayarmys [place] de Gwendalyn Ege, MD  ranitidine (ZANTAC) 75 MG tablet Take 1 tablet (75 mg total) by mouth daily. 01/09/12   Josalyn Funches, MD  Simethicone 180 MG CAPS Take 1 capsule (180 mg total) by mouth 3 (three) times daily as needed (gas/flatulance/bloating). 12/01/14   Hilton Sinclair, MD   Meds Ordered and Administered this Visit   Medications  gi cocktail (Maalox,Lidocaine,Donnatal) (30 mLs Oral Given 01/10/16 1743)    BP (!) 105/51 (BP Location: Left Arm)   Pulse 74   Temp 97.6 F (36.4 C) (Oral)   Resp 18   Ht 5\' 1"  (1.549 m)   Wt 145 lb (65.8 kg)   LMP 12/14/2015   SpO2 100%   BMI 27.40 kg/m  No data found.   Physical Exam  Urgent Care Course   Clinical Course    Procedures (including critical care time)  Labs Review Labs Reviewed  POCT URINALYSIS DIP (DEVICE) - Abnormal;  Notable for the following:       Result Value   Hgb urine dipstick MODERATE (*)    All other components within normal limits    Imaging Review No results found.   Visual Acuity Review  Right Eye Distance:   Left Eye Distance:   Bilateral Distance:    Right Eye Near:   Left Eye Near:    Bilateral Near:         MDM   1. Gastritis   2. Abdominal pain, unspecified abdominal location       Jacqualine Mau, NP 01/22/16 2040

## 2020-04-11 ENCOUNTER — Ambulatory Visit (HOSPITAL_COMMUNITY)
Admission: EM | Admit: 2020-04-11 | Discharge: 2020-04-11 | Disposition: A | Discharge status: Home / Self Care | Payer: HRSA Program | Attending: Emergency Medicine | Admitting: Emergency Medicine

## 2020-04-11 ENCOUNTER — Other Ambulatory Visit: Payer: Self-pay

## 2020-04-11 ENCOUNTER — Encounter (HOSPITAL_COMMUNITY): Payer: Self-pay | Admitting: Emergency Medicine

## 2020-04-11 DIAGNOSIS — H9202 Otalgia, left ear: Secondary | ICD-10-CM | POA: Insufficient documentation

## 2020-04-11 DIAGNOSIS — U071 COVID-19: Secondary | ICD-10-CM | POA: Diagnosis not present

## 2020-04-11 DIAGNOSIS — H9209 Otalgia, unspecified ear: Secondary | ICD-10-CM

## 2020-04-11 DIAGNOSIS — J029 Acute pharyngitis, unspecified: Secondary | ICD-10-CM | POA: Diagnosis present

## 2020-04-11 LAB — POCT RAPID STREP A, ED / UC: Streptococcus, Group A Screen (Direct): NEGATIVE

## 2020-04-11 MED ORDER — FLUTICASONE PROPIONATE 50 MCG/ACT NA SUSP: 1.0000 | Freq: Every day | NASAL | 0 refills | Status: DC

## 2020-04-11 MED ORDER — CETIRIZINE HCL 10 MG PO CAPS: 10.0000 mg | ORAL_CAPSULE | Freq: Every day | ORAL | 0 refills | Status: DC

## 2020-04-11 MED ORDER — IBUPROFEN 800 MG PO TABS: 800.0000 mg | ORAL_TABLET | Freq: Three times a day (TID) | ORAL | 0 refills | Status: DC

## 2020-04-11 NOTE — Discharge Instructions (Addendum)
Strep test negative, Covid test pending Begin daily cetirizine and Flonase to help with sinus congestion, ear pressure and drainage contributing to sore throat and ear pain Ibuprofen and Tylenol for pain Rest and fluids Follow-up if not improving or worsening

## 2020-04-11 NOTE — ED Triage Notes (Addendum)
Patient presents to urgent care today with symptoms of sore throat on left side and left ear pain. Symptoms began on Wednesday after having a tooth pulled on the left side. They have tried ibuprofen 800 mg  with no  relief of symptoms.

## 2020-04-12 LAB — CULTURE, GROUP A STREP (THRC)

## 2020-04-12 NOTE — ED Provider Notes (Signed)
[facility]    CSN: [id number] Arrival date & time: 04/11/20  1828      History   Chief Complaint Chief Complaint  Patient presents with   Sore Throat   Otalgia    HPI Alexandria Casey is a 44 y.o. female presenting today for evaluation of sore throat and ear pain.  Patient reports that over the past 5 days beginning Wednesday she developed sore throat and has had pain in her left ear.  Sore throat is mainly on the left side as well.  She reports symptoms started after having a tooth pulled on the left side.  She was started on amoxicillin 500 mg 3 times daily x5 days on Wednesday after tooth extraction.  She has had symptoms worsening since.  She denies any fevers.  Denies any cough, reports mild congestion.  Reports subjective fevers and body aches.  HPI  Past Medical History:  Diagnosis Date   ABNORMAL MATERNAL GLUCOSE TOLERANCE ANTEPARTUM 10/26/2009   Qualifier: Diagnosis of  By: Martinique, Bonnie     IUD (intrauterine device) in place 08/26/10    Patient Active Problem List   Diagnosis Date Noted   Pterygium of left eye 02/25/2015   Allergic conjunctivitis 11/11/2014   Unspecified constipation 02/23/2014   Multiple nevi 01/29/2013   Rash of face 36/14/4315   Umbilical hernia 40/01/6760   Pelvic pain in female 04/17/2012   Fatigue 01/23/2011   GERD 07/06/2010    History reviewed. No pertinent surgical history.  OB History   No obstetric history on file.      Home Medications    Prior to Admission medications   Medication Sig Start Date End Date Taking? Authorizing Provider  Cetirizine HCl 10 MG CAPS Take 1 capsule (10 mg total) by mouth daily for 10 days. 04/11/20 04/21/20  Guyla Bless C, PA-C  fluticasone (FLONASE) 50 MCG/ACT nasal spray Place 1-2 sprays into both nostrils daily. 04/11/20   Alleen Kehm C, PA-C  ibuprofen (ADVIL) 800 MG tablet Take 1 tablet (800 mg total) by mouth 3 (three) times daily. 04/11/20    Merry Pond C, PA-C  olopatadine (PATANOL) 0.1 % ophthalmic solution Place 1 drop into both eyes 2 (two) times daily. 12/01/14   Hilton Sinclair, MD  omeprazole (PRILOSEC) 40 MG capsule Take 1 capsule (40 mg total) by mouth 2 (two) times daily before a meal. 01/10/16 01/24/16  Wynona Luna, MD  desogestrel-ethinyl estradiol (APRI,EMOQUETTE,SOLIA) 0.15-30 MG-MCG tablet Take 1 tablet by mouth daily. 01/13/15 04/11/20  Dickie La, MD  levonorgestrel (MIRENA) 20 MCG/24HR IUD 1 each by Intrauterine route once.    04/11/20  [provider]  norgestimate-ethinyl estradiol (ORTHO-CYCLEN,SPRINTEC,PREVIFEM) 0.25-35 MG-MCG tablet Take 1 tablet by mouth daily. 03/22/14 04/11/20  Frazier Richards, MD  Norgestimate-Ethinyl Estradiol Triphasic (TRI-SPRINTEC) 0.18/0.215/0.25 MG-35 MCG tablet Take 1 tablet by mouth daily. 01/13/15 04/11/20  Dickie La, MD  ranitidine (ZANTAC) 75 MG tablet Take 1 tablet (75 mg total) by mouth daily. 01/09/12 04/11/20  Funches, Adriana Mccallum, MD  Simethicone 180 MG CAPS Take 1 capsule (180 mg total) by mouth 3 (three) times daily as needed (gas/flatulance/bloating). 12/01/14 04/11/20  Hilton Sinclair, MD    Family History Family History  Family history unknown: Yes    Social History Social History   Tobacco Use   Smoking status: Never Smoker   Smokeless tobacco: Never Used  Substance Use Topics   Alcohol use: No   Drug use: Not on file  Allergies   Doxycycline   Review of Systems Review of Systems  Constitutional: Negative for activity change, appetite change, chills, fatigue and fever.  HENT: Positive for congestion, ear pain, rhinorrhea and sore throat. Negative for sinus pressure and trouble swallowing.   Eyes: Negative for discharge and redness.  Respiratory: Negative for cough, chest tightness and shortness of breath.   Cardiovascular: Negative for chest pain.  Gastrointestinal: Negative for abdominal pain, diarrhea, nausea and  vomiting.  Musculoskeletal: Negative for myalgias.  Skin: Negative for rash.  Neurological: Negative for dizziness, light-headedness and headaches.     Physical Exam Triage Vital Signs ED Triage Vitals  Enc Vitals Group     BP 04/11/20 2014 99/62     Pulse Rate 04/11/20 2014 95     Resp 04/11/20 2014 17     Temp 04/11/20 2014 98.8 F (37.1 C)     Temp Source 04/11/20 2014 Oral     SpO2 04/11/20 2014 99 %     Weight --      Height --      Head Circumference --      Peak Flow --      Pain Score 04/11/20 2009 9     Pain Loc --      Pain Edu? --      Excl. in [place]? --    No data found.  Updated Vital Signs BP 99/62 (BP Location: Right Arm)    Pulse 95    Temp 98.8 F (37.1 C) (Oral)    Resp 17    SpO2 99%   Visual Acuity Right Eye Distance:   Left Eye Distance:   Bilateral Distance:    Right Eye Near:   Left Eye Near:    Bilateral Near:     Physical Exam Vitals and nursing note reviewed.  Constitutional:      Appearance: She is well-developed.     Comments: No acute distress  HENT:     Head: Normocephalic and atraumatic.     Ears:     Comments: Bilateral ears without tenderness to palpation of external auricle, tragus and mastoid, EAC's without erythema or swelling, TM's with good bony landmarks and cone of light. Non erythematous.     Nose: Nose normal.     Mouth/Throat:     Comments: Oral mucosa pink and moist, no tonsillar enlargement or exudate. Posterior pharynx patent and nonerythematous, no uvula deviation or swelling. Normal phonation. No soft palate swelling Eyes:     Conjunctiva/sclera: Conjunctivae normal.  Neck:     Comments: Left tonsillar lymph adenopathy Cardiovascular:     Rate and Rhythm: Normal rate.  Pulmonary:     Effort: Pulmonary effort is normal. No respiratory distress.  Abdominal:     General: There is no distension.  Musculoskeletal:        General: Normal range of motion.     Cervical back: Neck supple.  Skin:    General: Skin  is warm and dry.  Neurological:     Mental Status: She is alert and oriented to person, place, and time.      UC Treatments / Results  Labs (all labs ordered are listed, but only abnormal results are displayed) Labs Reviewed  CULTURE, GROUP A STREP ([place])  SARS CORONAVIRUS 2 (TAT 6-24 HRS)  POCT RAPID STREP A, ED / UC    EKG   Radiology No results found.  Procedures Procedures (including critical care time)  Medications Ordered in UC Medications - No data  to display  Initial Impression / Assessment and Plan / UC Course  I have reviewed the triage vital signs and the nursing notes.  Pertinent labs & imaging results that were available during my care of the patient were reviewed by me and considered in my medical decision making (see chart for details).    Covid test pending. Strep test negative, has been on amoxicillin x5 days with worsening symptoms, low suspicion of bacterial etiology, more likely underlying drainage/eustachian tube dysfunction contributing to symptoms and recommending initiating on cetirizine and Flonase.  Given symptoms did start after tooth extraction recommended close monitoring, no sign of deep space infection or Ludwig's angina at this time.Discussed strict return precautions. Patient verbalized understanding and is agreeable with plan.  Final Clinical Impressions(s) / UC Diagnoses   Final diagnoses:  Sore throat     Discharge Instructions     Strep test negative, Covid test pending Begin daily cetirizine and Flonase to help with sinus congestion, ear pressure and drainage contributing to sore throat and ear pain Ibuprofen and Tylenol for pain Rest and fluids Follow-up if not improving or worsening   ED Prescriptions    Medication Sig Dispense Auth. Provider   ibuprofen (ADVIL) 800 MG tablet Take 1 tablet (800 mg total) by mouth 3 (three) times daily. 21 tablet Caydn Justen C, PA-C   fluticasone (FLONASE) 50 MCG/ACT nasal spray Place  1-2 sprays into both nostrils daily. 16 g Tyleah Loh C, PA-C   Cetirizine HCl 10 MG CAPS Take 1 capsule (10 mg total) by mouth daily for 10 days. 10 capsule Jizelle Conkey, [place] C, PA-C     PDMP not reviewed this encounter.   Janith Lima, [place] 04/12/20 [phone]

## 2020-04-13 LAB — SARS CORONAVIRUS 2 (TAT 6-24 HRS): SARS Coronavirus 2: POSITIVE — AB

## 2020-04-13 LAB — CULTURE, GROUP A STREP (THRC)

## 2020-04-14 ENCOUNTER — Telehealth: Payer: Self-pay | Admitting: Nurse Practitioner

## 2020-04-14 LAB — CULTURE, GROUP A STREP (THRC)

## 2020-04-14 NOTE — Telephone Encounter (Signed)
Using interpreter services, I called patient to discuss Covid symptoms and the use of casirivimab/imdevimab, a monoclonal antibody infusion for those with mild to moderate Covid symptoms and at a high risk of hospitalization.  Pt is qualified for this infusion at the [facility] due to; Specific high risk criteria : BMI > 25  Unfortunately, her voicemail box is not set up and I was unable to leave a message.  Her name and information will remain in our hotline pool to be called back later.  Murray Hodgkins, NP

## 2020-04-15 NOTE — Telephone Encounter (Signed)
Using interpreter services I attempted to reach her again.   In speaking with Alexandria Casey her symptoms started on 10/20. Symptoms are a little better but still present. She is still having cold sweats and a small cough.   She politely declined treatment at this time as she is improved on day 9.    [name], MSN, NP-C [facility] [email or web address]

## 2020-05-24 LAB — HM PAP SMEAR

## 2020-05-27 ENCOUNTER — Other Ambulatory Visit: Payer: Self-pay | Admitting: Obstetrics & Gynecology

## 2020-05-27 DIAGNOSIS — Z1231 Encounter for screening mammogram for malignant neoplasm of breast: Secondary | ICD-10-CM

## 2020-06-07 ENCOUNTER — Ambulatory Visit
Admission: RE | Admit: 2020-06-07 | Discharge: 2020-06-07 | Disposition: A | Discharge status: Home / Self Care | Payer: No Typology Code available for payment source | Source: Ambulatory Visit | Attending: Obstetrics & Gynecology | Admitting: Obstetrics & Gynecology

## 2020-06-07 DIAGNOSIS — Z1231 Encounter for screening mammogram for malignant neoplasm of breast: Secondary | ICD-10-CM

## 2020-06-13 ENCOUNTER — Other Ambulatory Visit: Payer: Self-pay | Admitting: Obstetrics and Gynecology

## 2020-06-13 DIAGNOSIS — R928 Other abnormal and inconclusive findings on diagnostic imaging of breast: Secondary | ICD-10-CM

## 2020-06-30 ENCOUNTER — Ambulatory Visit: Payer: Self-pay | Admitting: *Deleted

## 2020-06-30 ENCOUNTER — Other Ambulatory Visit: Payer: Self-pay

## 2020-06-30 ENCOUNTER — Other Ambulatory Visit: Payer: Self-pay | Admitting: Obstetrics and Gynecology

## 2020-06-30 ENCOUNTER — Ambulatory Visit
Admission: RE | Admit: 2020-06-30 | Discharge: 2020-06-30 | Disposition: A | Discharge status: Home / Self Care | Payer: No Typology Code available for payment source | Source: Ambulatory Visit | Attending: Obstetrics and Gynecology | Admitting: Obstetrics and Gynecology

## 2020-06-30 VITALS — BP 112/74 | Wt 154.8 lb

## 2020-06-30 DIAGNOSIS — R921 Mammographic calcification found on diagnostic imaging of breast: Secondary | ICD-10-CM

## 2020-06-30 DIAGNOSIS — R928 Other abnormal and inconclusive findings on diagnostic imaging of breast: Secondary | ICD-10-CM

## 2020-06-30 DIAGNOSIS — Z1239 Encounter for other screening for malignant neoplasm of breast: Secondary | ICD-10-CM

## 2020-06-30 NOTE — Patient Instructions (Signed)
Explained breast self awareness with Alexandria Casey. Patient did not need a Pap smear today due to last Pap smear was in December 2021 per patient. Let her know BCCCP will cover Pap smears every 3 years unless has a history of abnormal Pap smears. Referred patient to the [facility] for a right breast diagnostic mammogram per recommendation. Appointment scheduled Thursday, June 30, 2020 at 1240. Patient aware of appointment and will be there.  Alexandria Casey verbalized understanding.  Alexandria Casey, Arvil Chaco, RN 11:05 AM

## 2020-06-30 NOTE — Progress Notes (Signed)
[name] is a 45 y.o. female who presents to [facility] clinic today with no complaints. Patient referred to [facility] due to having a screening mammogram completed 06/07/2020 that additional imaging of the right breast is recommended.   Pap Smear: Pap smear not completed today. Last Pap smear was in December 2021 at the [facility] clinic and was normal per patient. Per patient has no history of an abnormal Pap smear. Last Pap smear result is not available in Epic.   Physical exam: Breasts Breasts symmetrical. No skin abnormalities bilateral breasts. No nipple retraction bilateral breasts. No nipple discharge bilateral breasts. No lymphadenopathy. No lumps palpated bilateral breasts. No complaints of pain or tenderness on exam.       Pelvic/Bimanual Pap is not indicated today per BCCCP guidelines.    Smoking History: Patient has never smoked.   Patient Navigation: Patient education provided. Access to services provided for patient through [facility] program. Spanish interpreter Rudene Anda from [facility] provided.  Breast and Cervical Cancer Risk Assessment: Patient has family history of a paternal aunt having breast cancer. Patient has no known genetic mutations or history of radiation treatment to the chest before age 45. Patient does not have history of cervical dysplasia, immunocompromised, or DES exposure in-utero.  Risk Assessment    Risk Scores      06/30/2020   Last edited by: Royston Bake, CMA   5-year risk: 0.4 %   Lifetime risk: 6.3 %          A: BCCCP exam without pap smear No complaints.  P: Referred patient to the [facility] for a right breast diagnostic mammogram per recommendation. Appointment scheduled Thursday, June 30, 2020 at 1240.  Loletta Parish, RN 06/30/2020 11:05 AM

## 2020-07-13 ENCOUNTER — Ambulatory Visit
Admission: RE | Admit: 2020-07-13 | Discharge: 2020-07-13 | Disposition: A | Discharge status: Home / Self Care | Payer: No Typology Code available for payment source | Source: Ambulatory Visit | Attending: Obstetrics and Gynecology | Admitting: Obstetrics and Gynecology

## 2020-07-13 ENCOUNTER — Other Ambulatory Visit: Payer: Self-pay

## 2020-07-13 ENCOUNTER — Other Ambulatory Visit: Payer: Self-pay | Admitting: Obstetrics and Gynecology

## 2020-07-13 ENCOUNTER — Other Ambulatory Visit: Payer: Self-pay | Admitting: Diagnostic Radiology

## 2020-07-13 DIAGNOSIS — R921 Mammographic calcification found on diagnostic imaging of breast: Secondary | ICD-10-CM

## 2020-07-18 ENCOUNTER — Telehealth: Payer: Self-pay

## 2020-07-18 NOTE — Telephone Encounter (Signed)
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 Via Lavon Paganini, patient verified that she is not a [name] citizen, and understands she is not elegible for [employer]. Patient to complete [facility] application.

## 2020-08-10 ENCOUNTER — Ambulatory Visit: Payer: Self-pay | Admitting: Surgery

## 2020-08-10 DIAGNOSIS — N6091 Unspecified benign mammary dysplasia of right breast: Secondary | ICD-10-CM

## 2020-08-10 NOTE — H&P (Signed)
Alexandria Casey Appointment: 08/10/2020 3:30 PM Location: [name] Patient #: [id number] DOB: Sep 26, 1975 Married / Language: Undefined / Race: Undefined Female  History of Present Illness Alexandria Casey A. Michon [name]; 08/10/2020 4:25 PM) Patient words: Patient presents for evaluation of abnormal screening mammogram. A 1 cm cluster of atypical calcifications was noted right breast upper outer quadrant. Additional views and core biopsy revealed atypical lobular hyperplasia. No history of breast pain, nipple discharge or breast mass. She is accompanied by family translates per patient request.                 Screening.  EXAM: DIGITAL SCREENING BILATERAL MAMMOGRAM WITH TOMO AND CAD  COMPARISON: None.  ACR Breast Density Category c: The breast tissue is heterogeneously dense, which may obscure small masses.  FINDINGS: In the right breast, calcifications warrant further evaluation with magnified views. In the left breast, no findings suspicious for malignancy. Images were processed with CAD.  IMPRESSION: Further evaluation is suggested for calcifications in the right breast.  RECOMMENDATION: Diagnostic mammogram of the right breast. (Code:[id number])  The patient will be contacted regarding the findings, and additional imaging will be scheduled.  BI-RADS CATEGORY 0: Incomplete. Need additional imaging evaluation and/or prior mammograms for comparison.   Electronically Signed By: [name] M.D. On: 06/09/2020 12:01    Screening recall for right breast calcifications.  EXAM: DIGITAL DIAGNOSTIC RIGHT MAMMOGRAM WITH CAD  COMPARISON: Previous exam(s).  ACR Breast Density Category c: The breast tissue is heterogeneously dense, which may obscure small masses.  FINDINGS: In the upper-outer quadrant of the right breast, middle to anterior depth, there is a 1.1 cm group of amorphous calcifications.  Mammographic images were processed  with CAD.  IMPRESSION: Indeterminate 1.1 cm group of calcifications in the upper-outer right breast.  RECOMMENDATION: Stereotactic biopsy is recommended, and has been scheduled for 07/13/2020 at 10:30 a.m.  I have discussed the findings and recommendations with the patient. If applicable, a reminder letter will be sent to the patient regarding the next appointment.  BI-RADS CATEGORY 4: Suspicious.   Electronically Signed By: [name] M.D. 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 - ADENOSIS AND FIBROCYSTIC CHANGE WITH CALCIFICATIONS AND CYST RUPTURE.  The patient is a 45 year old female.   Past Surgical History Alexandria Casey, CMA; 08/10/2020 3:17 PM) No pertinent past surgical history  Diagnostic Studies History Alexandria Casey, CMA; 08/10/2020 3:17 PM) Colonoscopy never Mammogram within last year Pap Smear 1-5 years ago  Allergies (Kheana Casey, CMA; 08/10/2020 3:18 PM) No Known Drug Allergies [08/10/2020]: No Known Allergies [08/10/2020]: Allergies Reconciled  Medication History (Kheana Casey, CMA; 08/10/2020 3:18 PM) No Current Medications Medications Reconciled  Social History Alexandria Casey, CMA; 08/10/2020 3:17 PM) Alcohol use Occasional alcohol use. No caffeine use No drug use Tobacco use Never smoker.  Family History Alexandria Casey, CMA; 08/10/2020 3:17 PM) Diabetes Mellitus Mother. Hypertension Brother. Kidney Disease Mother. Melanoma Mother. Ovarian Cancer Mother.  Pregnancy / Birth History Alexandria Casey, CMA; 08/10/2020 3:17 PM) Age at menarche 18 years. Contraceptive History Oral contraceptives. Gravida 4 Length (months) of breastfeeding 12-24 Maternal age 43-20 Para 3 Regular periods  Other  Problems Alexandria Casey, CMA; 08/10/2020 3:17 PM) Arthritis Gastroesophageal Reflux Disease Umbilical Hernia Repair     Review of Systems (Kheana Casey CMA; 08/10/2020 3:17 PM) General Present- Fatigue, Night Sweats and Weight Gain. Not Present- Appetite Loss, Chills, Fever  and Weight Loss. Skin Present- Dryness. Not Present- Change in Wart/Mole, Hives, Jaundice, New Lesions, Non-Healing Wounds, Rash and Ulcer. HEENT Not Present- Earache, Hearing Loss, Hoarseness, Nose Bleed, Oral Ulcers, Ringing in the Ears, Seasonal Allergies, Sinus Pain, Sore Throat, Visual Disturbances, Wears glasses/contact lenses and Yellow Eyes. Respiratory Not Present- Bloody sputum, Chronic Cough, Difficulty Breathing, Snoring and Wheezing. Breast Present- Breast Pain. Not Present- Breast Mass, Nipple Discharge and Skin Changes. Cardiovascular Not Present- Chest Pain, Difficulty Breathing Lying Down, Leg Cramps, Palpitations, Rapid Heart Rate, Shortness of Breath and Swelling of Extremities. Gastrointestinal Present- Constipation and Hemorrhoids. Not Present- Abdominal Pain, Bloating, Bloody Stool, Change in Bowel Habits, Chronic diarrhea, Difficulty Swallowing, Excessive gas, Gets full quickly at meals, Indigestion, Nausea, Rectal Pain and Vomiting. Female Genitourinary Not Present- Frequency, Nocturia, Painful Urination, Pelvic Pain and Urgency. Musculoskeletal Present- Joint Pain, Joint Stiffness, Muscle Weakness and Swelling of Extremities. Not Present- Back Pain and Muscle Pain. Neurological Not Present- Decreased Memory, Fainting, Headaches, Numbness, Seizures, Tingling, Tremor, Trouble walking and Weakness. Psychiatric Not Present- Anxiety, Bipolar, Change in Sleep Pattern, Depression, Fearful and Frequent crying. Endocrine Not Present- Cold Intolerance, Excessive Hunger, Hair Changes, Heat Intolerance, Hot flashes and New Diabetes. Hematology Not Present- Blood Thinners, Easy Bruising,  Excessive bleeding, Gland problems, HIV and Persistent Infections.  Vitals (Kheana Casey CMA; 08/10/2020 3:18 PM) 08/10/2020 3:18 PM Weight: 157.25 lb Height: 63in Body Surface Area: 1.75 m Body Mass Index: 27.86 kg/m  Temp.: 74F  Pulse: 88 (Regular)  P.OX: 97% (Room air) BP: 108/70(Sitting, Left Arm, Standard)        Physical Exam (Marijayne Rauth A. Lynford Espinoza MD; 08/10/2020 4:26 PM)  General Mental Status-Alert. General Appearance-Consistent with stated age. Hydration-Well hydrated. Voice-Normal.  Head and Neck Head-normocephalic, atraumatic with no lesions or palpable masses. Trachea-midline. Thyroid Gland Characteristics - normal size and consistency.  Eye Eyeball - Bilateral-Extraocular movements intact. Sclera/Conjunctiva - Bilateral-No scleral icterus.  Chest and Lung Exam Chest and lung exam reveals -quiet, even and easy respiratory effort with no use of accessory muscles and on auscultation, normal breath sounds, no adventitious sounds and normal vocal resonance. Inspection Chest Wall - Normal. Back - normal.  Breast Breast - Left-Symmetric, Non Tender, No Biopsy scars, no Dimpling - Left, No Inflammation, No Lumpectomy scars, No Mastectomy scars, No Peau d' Orange. Breast - Right-Symmetric, Non Tender, No Biopsy scars, no Dimpling - Right, No Inflammation, No Lumpectomy scars, No Mastectomy scars, No Peau d' Orange. Breast Lump-No Palpable Breast Mass.  Cardiovascular Cardiovascular examination reveals -normal heart sounds, regular rate and rhythm with no murmurs and normal pedal pulses bilaterally.  Abdomen Inspection Inspection of the abdomen reveals - No Hernias. Skin - Scar - no surgical scars. Palpation/Percussion Palpation and Percussion of the abdomen reveal - Soft, Non Tender, No Rebound tenderness, No Rigidity (guarding) and No hepatosplenomegaly. Auscultation Auscultation of the abdomen reveals - Bowel  sounds normal.  Neurologic Neurologic evaluation reveals -alert and oriented x 3 with no impairment of recent or remote memory. Mental Status-Normal.  Musculoskeletal Normal Exam - Left-Upper Extremity Strength Normal and Lower Extremity Strength Normal. Normal Exam - Right-Upper Extremity Strength Normal and Lower Extremity Strength Normal.  Lymphatic Head & Neck  General Head & Neck Lymphatics: Bilateral - Description - Normal. Axillary  General Axillary Region: Bilateral - Description - Normal. Tenderness - Non Tender. Femoral & Inguinal - Did not examine.    Assessment & Plan (Hong Moring A. 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 Risks and benefits of surgery were discussed as well as rationale for lumpectomy. Risk of lumpectomy include bleeding, infection, seroma, more surgery, use of seed/wire, wound care, cosmetic deformity and the need for other treatments, death , blood clots, death. Pt agrees to proceed. Questions were answered with the assistance of her family today for translation purposes.   total time 30 min  Current Plans Pt Education - [facility] Free Text Education/Instructions: discussed with patient and provided information. Pt Education - [facility] Breast Biopsy HCI: discussed with patient and provided information. You are being scheduled for surgery- Our schedulers will call you.  You should hear from our office's scheduling department within 5 working days about the location, date, and time of surgery. We try to make accommodations for patient's preferences in scheduling surgery, but sometimes the OR schedule or the surgeon's schedule prevents [name] from making those accommodations.  If you have not heard from our office [phone]) in 5 working days, call the office and ask for your surgeon's nurse.  If you have other  questions about your diagnosis, plan, or surgery, call the office and ask for your surgeon's nurse.  Pt Education - [facility] Breast Biopsy HCI: discussed with patient and provided information.

## 2020-08-10 NOTE — H&P (View-Only) (Signed)
Alexandria Casey Appointment: 08/10/2020 3:30 PM Location: [facility] Patient #: [id number] DOB: 07/02/1975 Married / Language: Undefined / Race: Undefined Female  History of Present Illness Alexandria Casey [name] MD; 08/10/2020 4:25 PM) Patient words: Patient presents for evaluation of abnormal screening mammogram. A 1 cm cluster of atypical calcifications was noted right breast upper outer quadrant. Additional views and core biopsy revealed atypical lobular hyperplasia. No history of breast pain, nipple discharge or breast mass. She is accompanied by family translates per patient request.                 Screening.  EXAM: DIGITAL SCREENING BILATERAL MAMMOGRAM WITH TOMO AND CAD  COMPARISON: None.  ACR Breast Density Category c: The breast tissue is heterogeneously dense, which may obscure small masses.  FINDINGS: In the right breast, calcifications warrant further evaluation with magnified views. In the left breast, no findings suspicious for malignancy. Images were processed with CAD.  IMPRESSION: Further evaluation is suggested for calcifications in the right breast.  RECOMMENDATION: Diagnostic mammogram of the right breast. (Code:[id number])  The patient will be contacted regarding the findings, and additional imaging will be scheduled.  BI-RADS CATEGORY 0: Incomplete. Need additional imaging evaluation and/or prior mammograms for comparison.   Electronically Signed By: Alexandria Casey M.D. On: 06/09/2020 12:01    Screening recall for right breast calcifications.  EXAM: DIGITAL DIAGNOSTIC RIGHT MAMMOGRAM WITH CAD  COMPARISON: Previous exam(s).  ACR Breast Density Category c: The breast tissue is heterogeneously dense, which may obscure small masses.  FINDINGS: In the upper-outer quadrant of the right breast, middle to anterior depth, there is a 1.1 cm group of amorphous calcifications.  Mammographic images were processed  with CAD.  IMPRESSION: Indeterminate 1.1 cm group of calcifications in the upper-outer right breast.  RECOMMENDATION: Stereotactic biopsy is recommended, and has been scheduled for 07/13/2020 at 10:30 Alexandriam.  I have discussed the findings and recommendations with the patient. If applicable, a reminder letter will be sent to the patient regarding the next appointment.  BI-RADS CATEGORY 4: Suspicious.   Electronically Signed By: Alexandria Casey M.D. 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). - ADENOSIS AND FIBROCYSTIC CHANGE WITH CALCIFICATIONS AND CYST RUPTURE.  The patient is a 45 year old female.   Past Surgical History Alexandria Casey, CMA; 08/10/2020 3:17 PM) No pertinent past surgical history  Diagnostic Studies History Alexandria Casey, CMA; 08/10/2020 3:17 PM) Colonoscopy never Mammogram within last year Pap Smear 1-5 years ago  Allergies (Alexandria Casey, CMA; 08/10/2020 3:18 PM) No Known Drug Allergies [08/10/2020]: No Known Allergies [08/10/2020]: Allergies Reconciled  Medication History (Alexandria Casey, CMA; 08/10/2020 3:18 PM) No Current Medications Medications Reconciled  Social History Alexandria Casey, CMA; 08/10/2020 3:17 PM) Alcohol use Occasional alcohol use. No caffeine use No drug use Tobacco use Never smoker.  Family History Alexandria Casey, CMA; 08/10/2020 3:17 PM) Diabetes Mellitus Mother. Hypertension Brother. Kidney Disease Mother. Melanoma Mother. Ovarian Cancer Mother.  Pregnancy / Birth History Alexandria Casey, CMA; 08/10/2020 3:17 PM) Age at menarche 29 years. Contraceptive History Oral contraceptives. Gravida 4 Length (months) of breastfeeding 12-24 Maternal age 93-20 Para 3 Regular periods  Other  Problems Alexandria Casey, CMA; 08/10/2020 3:17 PM) Arthritis Gastroesophageal Reflux Disease Umbilical Hernia Repair     Review of Systems (Alexandria Casey CMA; 08/10/2020 3:17 PM) General Present- Fatigue, Night Sweats and Weight Gain. Not Present- Appetite Loss, Chills, Fever  and Weight Loss. Skin Present- Dryness. Not Present- Change in Wart/Mole, Hives, Jaundice, New Lesions, Non-Healing Wounds, Rash and Ulcer. HEENT Not Present- Earache, Hearing Loss, Hoarseness, Nose Bleed, Oral Ulcers, Ringing in the Ears, Seasonal Allergies, Sinus Pain, Sore Throat, Visual Disturbances, Wears glasses/contact lenses and Yellow Eyes. Respiratory Not Present- Bloody sputum, Chronic Cough, Difficulty Breathing, Snoring and Wheezing. Breast Present- Breast Pain. Not Present- Breast Mass, Nipple Discharge and Skin Changes. Cardiovascular Not Present- Chest Pain, Difficulty Breathing Lying Down, Leg Cramps, Palpitations, Rapid Heart Rate, Shortness of Breath and Swelling of Extremities. Gastrointestinal Present- Constipation and Hemorrhoids. Not Present- Abdominal Pain, Bloating, Bloody Stool, Change in Bowel Habits, Chronic diarrhea, Difficulty Swallowing, Excessive gas, Gets full quickly at meals, Indigestion, Nausea, Rectal Pain and Vomiting. Female Genitourinary Not Present- Frequency, Nocturia, Painful Urination, Pelvic Pain and Urgency. Musculoskeletal Present- Joint Pain, Joint Stiffness, Muscle Weakness and Swelling of Extremities. Not Present- Back Pain and Muscle Pain. Neurological Not Present- Decreased Memory, Fainting, Headaches, Numbness, Seizures, Tingling, Tremor, Trouble walking and Weakness. Psychiatric Not Present- Anxiety, Bipolar, Change in Sleep Pattern, Depression, Fearful and Frequent crying. Endocrine Not Present- Cold Intolerance, Excessive Hunger, Hair Changes, Heat Intolerance, Hot flashes and New Diabetes. Hematology Not Present- Blood Thinners, Easy Bruising,  Excessive bleeding, Gland problems, HIV and Persistent Infections.  Vitals (Alexandria Casey CMA; 08/10/2020 3:18 PM) 08/10/2020 3:18 PM Weight: 157.25 lb Height: 63in Body Surface Area: 1.75 m Body Mass Index: 27.86 kg/m  Temp.: 71F  Pulse: 88 (Regular)  P.OX: 97% (Room air) BP: 108/70(Sitting, Left Arm, Standard)        Physical Exam (Alexandria Palazzi A. Tameya Kuznia MD; 08/10/2020 4:26 PM)  General Mental Status-Alert. General Appearance-Consistent with stated age. Hydration-Well hydrated. Voice-Normal.  Head and Neck Head-normocephalic, atraumatic with no lesions or palpable masses. Trachea-midline. Thyroid Gland Characteristics - normal size and consistency.  Eye Eyeball - Bilateral-Extraocular movements intact. Sclera/Conjunctiva - Bilateral-No scleral icterus.  Chest and Lung Exam Chest and lung exam reveals -quiet, even and easy respiratory effort with no use of accessory muscles and on auscultation, normal breath sounds, no adventitious sounds and normal vocal resonance. Inspection Chest Wall - Normal. Back - normal.  Breast Breast - Left-Symmetric, Non Tender, No Biopsy scars, no Dimpling - Left, No Inflammation, No Lumpectomy scars, No Mastectomy scars, No Peau d' Orange. Breast - Right-Symmetric, Non Tender, No Biopsy scars, no Dimpling - Right, No Inflammation, No Lumpectomy scars, No Mastectomy scars, No Peau d' Orange. Breast Lump-No Palpable Breast Mass.  Cardiovascular Cardiovascular examination reveals -normal heart sounds, regular rate and rhythm with no murmurs and normal pedal pulses bilaterally.  Abdomen Inspection Inspection of the abdomen reveals - No Hernias. Skin - Scar - no surgical scars. Palpation/Percussion Palpation and Percussion of the abdomen reveal - Soft, Non Tender, No Rebound tenderness, No Rigidity (guarding) and No hepatosplenomegaly. Auscultation Auscultation of the abdomen reveals - Bowel  sounds normal.  Neurologic Neurologic evaluation reveals -alert and oriented x 3 with no impairment of recent or remote memory. Mental Status-Normal.  Musculoskeletal Normal Exam - Left-Upper Extremity Strength Normal and Lower Extremity Strength Normal. Normal Exam - Right-Upper Extremity Strength Normal and Lower Extremity Strength Normal.  Lymphatic Head & Neck  General Head & Neck Lymphatics: Bilateral - Description - Normal. Axillary  General Axillary Region: Bilateral - Description - Normal. Tenderness - Non Tender. Femoral & Inguinal - Did not examine.    Assessment & Plan (Generoso Cropper A. Christie Viscomi MD; 08/10/2020 4:28 PM)  ATYPICAL LOBULAR HYPERPLASIA ([facility]) OF RIGHT BREAST (N60.91) Impression: Recommend  right breast seeds localized lumpectomy given atypia on core biopsy. Risks and benefits of surgery were discussed as well as rationale for lumpectomy. Risk of lumpectomy include bleeding, infection, seroma, more surgery, use of seed/wire, wound care, cosmetic deformity and the need for other treatments, death , blood clots, death. Pt agrees to proceed. Questions were answered with the assistance of her family today for translation purposes.   total time 30 min  Current Plans Pt Education - [facility] Free Text Education/Instructions: discussed with patient and provided information. Pt Education - [facility] Breast Biopsy HCI: discussed with patient and provided information. You are being scheduled for surgery- Our schedulers will call you.  You should hear from our office's scheduling department within 5 working days about the location, date, and time of surgery. We try to make accommodations for patient's preferences in scheduling surgery, but sometimes the OR schedule or the surgeon's schedule prevents [name] from making those accommodations.  If you have not heard from our office [phone]) in 5 working days, call the office and ask for your surgeon's nurse.  If you have other  questions about your diagnosis, plan, or surgery, call the office and ask for your surgeon's nurse.  Pt Education - [facility] Breast Biopsy HCI: discussed with patient and provided information.

## 2020-08-15 ENCOUNTER — Other Ambulatory Visit: Payer: Self-pay | Admitting: Surgery

## 2020-08-15 DIAGNOSIS — N6091 Unspecified benign mammary dysplasia of right breast: Secondary | ICD-10-CM

## 2020-08-24 ENCOUNTER — Telehealth: Payer: Self-pay

## 2020-08-24 NOTE — Telephone Encounter (Signed)
Per Etheleen Sia, RN, Patient's right breast biopsy revealed lobular neoplasia. If patient is a [name] citizen, can apply for [employer] (6 months). Via Lavon Paganini, Spanish Interpreter, patient stated that she was not a [name] citizen, and will complete [employer].

## 2020-08-31 ENCOUNTER — Other Ambulatory Visit: Payer: Self-pay

## 2020-08-31 ENCOUNTER — Encounter (HOSPITAL_BASED_OUTPATIENT_CLINIC_OR_DEPARTMENT_OTHER): Payer: Self-pay | Admitting: Surgery

## 2020-09-03 ENCOUNTER — Inpatient Hospital Stay (HOSPITAL_COMMUNITY): Admission: RE | Admit: 2020-09-03 | Payer: No Typology Code available for payment source | Source: Ambulatory Visit

## 2020-09-05 ENCOUNTER — Encounter (HOSPITAL_BASED_OUTPATIENT_CLINIC_OR_DEPARTMENT_OTHER)
Admission: RE | Admit: 2020-09-05 | Discharge: 2020-09-05 | Disposition: A | Discharge status: Home / Self Care | Payer: No Typology Code available for payment source | Source: Ambulatory Visit | Attending: Surgery | Admitting: Surgery

## 2020-09-05 ENCOUNTER — Other Ambulatory Visit (HOSPITAL_COMMUNITY)
Admission: RE | Admit: 2020-09-05 | Discharge: 2020-09-05 | Disposition: A | Discharge status: Home / Self Care | Payer: No Typology Code available for payment source | Source: Ambulatory Visit | Attending: Surgery | Admitting: Surgery

## 2020-09-05 DIAGNOSIS — Z01812 Encounter for preprocedural laboratory examination: Secondary | ICD-10-CM | POA: Insufficient documentation

## 2020-09-05 DIAGNOSIS — Z20822 Contact with and (suspected) exposure to covid-19: Secondary | ICD-10-CM | POA: Insufficient documentation

## 2020-09-05 LAB — COMPREHENSIVE METABOLIC PANEL
ALT: 14 U/L (ref 0–44)
AST: 21 U/L (ref 15–41)
Albumin: 3.7 g/dL (ref 3.5–5.0)
Alkaline Phosphatase: 49 U/L (ref 38–126)
Anion gap: 11 (ref 5–15)
BUN: 8 mg/dL (ref 6–20)
CO2: 24 mmol/L (ref 22–32)
Calcium: 9.2 mg/dL (ref 8.9–10.3)
Chloride: 101 mmol/L (ref 98–111)
Creatinine, Ser: 0.51 mg/dL (ref 0.44–1.00)
GFR, Estimated: >60 mL/min (ref >60)
Glucose, Bld: 154 mg/dL — ABNORMAL HIGH (ref 70–99)
Potassium: 3.5 mmol/L (ref 3.5–5.1)
Sodium: 136 mmol/L (ref 135–145)
Total Bilirubin: 0.3 mg/dL (ref 0.3–1.2)
Total Protein: 6.8 g/dL (ref 6.5–8.1)

## 2020-09-05 LAB — CBC WITH DIFFERENTIAL/PLATELET
Abs Immature Granulocytes: 0.01 10*3/uL (ref 0.00–0.07)
Basophils Absolute: 0.1 10*3/uL (ref 0.0–0.1)
Basophils Relative: 1 %
Eosinophils Absolute: 0.1 10*3/uL (ref 0.0–0.5)
Eosinophils Relative: 1 %
HCT: 37.7 % (ref 36.0–46.0)
Hemoglobin: 13 g/dL (ref 12.0–15.0)
Immature Granulocytes: 0 %
Lymphocytes Relative: 34 %
Lymphs Abs: 1.8 10*3/uL (ref 0.7–4.0)
MCH: 29.3 pg (ref 26.0–34.0)
MCHC: 34.5 g/dL (ref 30.0–36.0)
MCV: 85.1 fL (ref 80.0–100.0)
Monocytes Absolute: 0.3 10*3/uL (ref 0.1–1.0)
Monocytes Relative: 6 %
Neutro Abs: 3.1 10*3/uL (ref 1.7–7.7)
Neutrophils Relative %: 58 %
Platelets: 311 10*3/uL (ref 150–400)
RBC: 4.43 MIL/uL (ref 3.87–5.11)
RDW: 12.4 % (ref 11.5–15.5)
WBC: 5.3 10*3/uL (ref 4.0–10.5)
nRBC: 0 % (ref 0.0–0.2)

## 2020-09-05 NOTE — Progress Notes (Signed)

## 2020-09-06 ENCOUNTER — Ambulatory Visit
Admission: RE | Admit: 2020-09-06 | Discharge: 2020-09-06 | Disposition: A | Discharge status: Home / Self Care | Payer: No Typology Code available for payment source | Source: Ambulatory Visit | Attending: Surgery | Admitting: Surgery

## 2020-09-06 ENCOUNTER — Other Ambulatory Visit: Payer: Self-pay

## 2020-09-06 ENCOUNTER — Other Ambulatory Visit: Payer: Self-pay | Admitting: Surgery

## 2020-09-06 DIAGNOSIS — N6091 Unspecified benign mammary dysplasia of right breast: Secondary | ICD-10-CM

## 2020-09-06 LAB — SARS CORONAVIRUS 2 (TAT 6-24 HRS): SARS Coronavirus 2: NEGATIVE

## 2020-09-07 ENCOUNTER — Encounter (HOSPITAL_BASED_OUTPATIENT_CLINIC_OR_DEPARTMENT_OTHER): Payer: Self-pay | Admitting: Surgery

## 2020-09-07 ENCOUNTER — Ambulatory Visit (HOSPITAL_BASED_OUTPATIENT_CLINIC_OR_DEPARTMENT_OTHER): Payer: No Typology Code available for payment source | Admitting: Certified Registered Nurse Anesthetist

## 2020-09-07 ENCOUNTER — Other Ambulatory Visit: Payer: Self-pay

## 2020-09-07 ENCOUNTER — Ambulatory Visit (HOSPITAL_BASED_OUTPATIENT_CLINIC_OR_DEPARTMENT_OTHER)
Admission: RE | Admit: 2020-09-07 | Discharge: 2020-09-07 | Disposition: A | Discharge status: Home / Self Care | Payer: No Typology Code available for payment source | Attending: Surgery | Admitting: Surgery

## 2020-09-07 ENCOUNTER — Encounter (HOSPITAL_BASED_OUTPATIENT_CLINIC_OR_DEPARTMENT_OTHER)
Admission: RE | Disposition: A | Discharge status: Home / Self Care | Payer: Self-pay | Source: Home / Self Care | Attending: Surgery

## 2020-09-07 ENCOUNTER — Ambulatory Visit
Admission: RE | Admit: 2020-09-07 | Discharge: 2020-09-07 | Disposition: A | Discharge status: Home / Self Care | Payer: No Typology Code available for payment source | Source: Ambulatory Visit | Attending: Surgery | Admitting: Surgery

## 2020-09-07 DIAGNOSIS — Z8041 Family history of malignant neoplasm of ovary: Secondary | ICD-10-CM | POA: Insufficient documentation

## 2020-09-07 DIAGNOSIS — Z841 Family history of disorders of kidney and ureter: Secondary | ICD-10-CM | POA: Insufficient documentation

## 2020-09-07 DIAGNOSIS — N6021 Fibroadenosis of right breast: Secondary | ICD-10-CM | POA: Insufficient documentation

## 2020-09-07 DIAGNOSIS — Z833 Family history of diabetes mellitus: Secondary | ICD-10-CM | POA: Insufficient documentation

## 2020-09-07 DIAGNOSIS — N6091 Unspecified benign mammary dysplasia of right breast: Secondary | ICD-10-CM

## 2020-09-07 DIAGNOSIS — Z808 Family history of malignant neoplasm of other organs or systems: Secondary | ICD-10-CM | POA: Insufficient documentation

## 2020-09-07 DIAGNOSIS — N6489 Other specified disorders of breast: Secondary | ICD-10-CM | POA: Insufficient documentation

## 2020-09-07 DIAGNOSIS — Z8249 Family history of ischemic heart disease and other diseases of the circulatory system: Secondary | ICD-10-CM | POA: Insufficient documentation

## 2020-09-07 DIAGNOSIS — N644 Mastodynia: Secondary | ICD-10-CM | POA: Insufficient documentation

## 2020-09-07 HISTORY — PX: BREAST LUMPECTOMY WITH RADIOACTIVE SEED LOCALIZATION: SHX6424

## 2020-09-07 HISTORY — DX: Hypotension, unspecified: I95.9

## 2020-09-07 LAB — POCT PREGNANCY, URINE: Preg Test, Ur: NEGATIVE

## 2020-09-07 SURGERY — BREAST LUMPECTOMY WITH RADIOACTIVE SEED LOCALIZATION
Anesthesia: General | Site: Breast | Laterality: Right

## 2020-09-07 MED ORDER — SODIUM CHLORIDE 0.9 % IV SOLN
INTRAVENOUS | Status: DC | PRN
  Administered 2020-09-07: 500 mL

## 2020-09-07 MED ORDER — ACETAMINOPHEN 500 MG PO TABS
1000.0000 mg | ORAL_TABLET | ORAL | Status: AC
  Administered 2020-09-07: 1000 mg via ORAL

## 2020-09-07 MED ORDER — GLYCOPYRROLATE 0.2 MG/ML IJ SOLN
INTRAMUSCULAR | Status: DC | PRN
  Administered 2020-09-07 (×2): .1 mg via INTRAVENOUS

## 2020-09-07 MED ORDER — ONDANSETRON HCL 4 MG/2ML IJ SOLN
INTRAMUSCULAR | Status: DC | PRN
  Administered 2020-09-07: 4 mg via INTRAVENOUS

## 2020-09-07 MED ORDER — OXYCODONE HCL 5 MG PO TABS: 5.0000 mg | ORAL_TABLET | Freq: Four times a day (QID) | ORAL | 0 refills | Status: DC | PRN

## 2020-09-07 MED ORDER — CELECOXIB 200 MG PO CAPS
ORAL_CAPSULE | ORAL | Status: AC
  Filled 2020-09-07: qty 1

## 2020-09-07 MED ORDER — AMISULPRIDE (ANTIEMETIC) 5 MG/2ML IV SOLN: 10.0000 mg | Freq: Once | INTRAVENOUS | Status: DC | PRN

## 2020-09-07 MED ORDER — MEPERIDINE HCL 25 MG/ML IJ SOLN: 6.2500 mg | INTRAMUSCULAR | Status: DC | PRN

## 2020-09-07 MED ORDER — LIDOCAINE HCL (CARDIAC) PF 100 MG/5ML IV SOSY
PREFILLED_SYRINGE | INTRAVENOUS | Status: DC | PRN
  Administered 2020-09-07: 60 mg via INTRATRACHEAL

## 2020-09-07 MED ORDER — MIDAZOLAM HCL 2 MG/2ML IJ SOLN
INTRAMUSCULAR | Status: AC
  Filled 2020-09-07: qty 2

## 2020-09-07 MED ORDER — CELECOXIB 200 MG PO CAPS
200.0000 mg | ORAL_CAPSULE | ORAL | Status: AC
  Administered 2020-09-07: 200 mg via ORAL

## 2020-09-07 MED ORDER — DEXAMETHASONE SODIUM PHOSPHATE 10 MG/ML IJ SOLN
INTRAMUSCULAR | Status: AC
  Filled 2020-09-07: qty 1

## 2020-09-07 MED ORDER — FENTANYL CITRATE (PF) 100 MCG/2ML IJ SOLN
INTRAMUSCULAR | Status: AC
  Filled 2020-09-07: qty 2

## 2020-09-07 MED ORDER — DEXAMETHASONE SODIUM PHOSPHATE 10 MG/ML IJ SOLN
INTRAMUSCULAR | Status: DC | PRN
  Administered 2020-09-07: 8 mg via INTRAVENOUS

## 2020-09-07 MED ORDER — CHLORHEXIDINE GLUCONATE CLOTH 2 % EX PADS: 6.0000 | MEDICATED_PAD | Freq: Once | CUTANEOUS | Status: DC

## 2020-09-07 MED ORDER — OXYCODONE HCL 5 MG PO TABS: 5.0000 mg | ORAL_TABLET | Freq: Once | ORAL | Status: DC | PRN

## 2020-09-07 MED ORDER — OXYCODONE HCL 5 MG/5ML PO SOLN: 5.0000 mg | Freq: Once | ORAL | Status: DC | PRN

## 2020-09-07 MED ORDER — CEFAZOLIN SODIUM-DEXTROSE 2-4 GM/100ML-% IV SOLN
2.0000 g | INTRAVENOUS | Status: AC
  Administered 2020-09-07: 2 g via INTRAVENOUS

## 2020-09-07 MED ORDER — CEFAZOLIN SODIUM-DEXTROSE 2-4 GM/100ML-% IV SOLN
INTRAVENOUS | Status: AC
  Filled 2020-09-07: qty 100

## 2020-09-07 MED ORDER — LIDOCAINE 2% (20 MG/ML) 5 ML SYRINGE
INTRAMUSCULAR | Status: AC
  Filled 2020-09-07: qty 5

## 2020-09-07 MED ORDER — ONDANSETRON HCL 4 MG/2ML IJ SOLN
INTRAMUSCULAR | Status: AC
  Filled 2020-09-07: qty 2

## 2020-09-07 MED ORDER — PHENYLEPHRINE HCL (PRESSORS) 10 MG/ML IV SOLN
INTRAVENOUS | Status: DC | PRN
  Administered 2020-09-07: 80 ug via INTRAVENOUS
  Administered 2020-09-07 (×2): 120 ug via INTRAVENOUS

## 2020-09-07 MED ORDER — GABAPENTIN 300 MG PO CAPS
300.0000 mg | ORAL_CAPSULE | ORAL | Status: AC
  Administered 2020-09-07: 300 mg via ORAL

## 2020-09-07 MED ORDER — SODIUM CHLORIDE 0.9 % IV SOLN
INTRAVENOUS | Status: AC
  Filled 2020-09-07: qty 10

## 2020-09-07 MED ORDER — PROMETHAZINE HCL 25 MG/ML IJ SOLN: 6.2500 mg | INTRAMUSCULAR | Status: DC | PRN

## 2020-09-07 MED ORDER — ACETAMINOPHEN 500 MG PO TABS
ORAL_TABLET | ORAL | Status: AC
  Filled 2020-09-07: qty 2

## 2020-09-07 MED ORDER — FENTANYL CITRATE (PF) 250 MCG/5ML IJ SOLN
INTRAMUSCULAR | Status: DC | PRN
  Administered 2020-09-07 (×2): 25 ug via INTRAVENOUS

## 2020-09-07 MED ORDER — LACTATED RINGERS IV SOLN: INTRAVENOUS | Status: DC

## 2020-09-07 MED ORDER — BUPIVACAINE-EPINEPHRINE (PF) 0.25% -1:200000 IJ SOLN
INTRAMUSCULAR | Status: DC | PRN
  Administered 2020-09-07: 15 mL

## 2020-09-07 MED ORDER — PHENYLEPHRINE 40 MCG/ML (10ML) SYRINGE FOR IV PUSH (FOR BLOOD PRESSURE SUPPORT)
PREFILLED_SYRINGE | INTRAVENOUS | Status: AC
  Filled 2020-09-07: qty 10

## 2020-09-07 MED ORDER — HYDROMORPHONE HCL 1 MG/ML IJ SOLN: 0.2500 mg | INTRAMUSCULAR | Status: DC | PRN

## 2020-09-07 MED ORDER — PROPOFOL 10 MG/ML IV BOLUS
INTRAVENOUS | Status: AC
  Filled 2020-09-07: qty 40

## 2020-09-07 MED ORDER — GABAPENTIN 300 MG PO CAPS
ORAL_CAPSULE | ORAL | Status: AC
  Filled 2020-09-07: qty 1

## 2020-09-07 MED ORDER — MIDAZOLAM HCL 2 MG/2ML IJ SOLN
INTRAMUSCULAR | Status: DC | PRN
  Administered 2020-09-07: 2 mg via INTRAVENOUS

## 2020-09-07 MED ORDER — PROPOFOL 10 MG/ML IV BOLUS
INTRAVENOUS | Status: DC | PRN
  Administered 2020-09-07: 200 mg via INTRAVENOUS

## 2020-09-07 MED ORDER — VANCOMYCIN HCL 500 MG IV SOLR
INTRAVENOUS | Status: AC
  Filled 2020-09-07: qty 500

## 2020-09-07 MED ORDER — VANCOMYCIN HCL 500 MG IV SOLR
INTRAVENOUS | Status: DC | PRN
  Administered 2020-09-07: 500 mg via TOPICAL

## 2020-09-07 MED ORDER — IBUPROFEN 800 MG PO TABS: 800.0000 mg | ORAL_TABLET | Freq: Three times a day (TID) | ORAL | 0 refills | Status: DC | PRN

## 2020-09-07 SURGICAL SUPPLY — 41 items
ADH SKN CLS APL DERMABOND .7 (GAUZE/BANDAGES/DRESSINGS) ×1
APL PRP STRL LF DISP 70% ISPRP (MISCELLANEOUS) ×1
BINDER BREAST LRG (GAUZE/BANDAGES/DRESSINGS) IMPLANT
BINDER BREAST XLRG (GAUZE/BANDAGES/DRESSINGS) IMPLANT
BLADE SURG 15 STRL LF DISP TIS (BLADE) ×1 IMPLANT
BLADE SURG 15 STRL SS (BLADE) ×2
CANISTER SUCT 1200ML W/VALVE (MISCELLANEOUS) ×2 IMPLANT
CHLORAPREP W/TINT 26 (MISCELLANEOUS) ×2 IMPLANT
COVER BACK TABLE 60X90IN (DRAPES) ×2 IMPLANT
COVER MAYO STAND STRL (DRAPES) ×2 IMPLANT
COVER PROBE W GEL 5X96 (DRAPES) ×2 IMPLANT
DERMABOND ADVANCED (GAUZE/BANDAGES/DRESSINGS) ×1
DERMABOND ADVANCED .7 DNX12 (GAUZE/BANDAGES/DRESSINGS) ×1 IMPLANT
DRAPE LAPAROTOMY 100X72 PEDS (DRAPES) ×2 IMPLANT
DRAPE UTILITY XL STRL (DRAPES) ×2 IMPLANT
ELECT COATED BLADE 2.86 ST (ELECTRODE) ×2 IMPLANT
ELECT REM PT RETURN 9FT ADLT (ELECTROSURGICAL) ×2
ELECTRODE REM PT RTRN 9FT ADLT (ELECTROSURGICAL) ×1 IMPLANT
GLOVE SRG 8 PF TXTR STRL LF DI (GLOVE) ×1 IMPLANT
GLOVE SURG ENC MOIS LTX SZ6.5 (GLOVE) ×2 IMPLANT
GLOVE SURG LTX SZ8 (GLOVE) ×2 IMPLANT
GLOVE SURG UNDER POLY LF SZ7 (GLOVE) ×2 IMPLANT
GLOVE SURG UNDER POLY LF SZ8 (GLOVE) ×2
GOWN STRL REUS W/ TWL LRG LVL3 (GOWN DISPOSABLE) ×1 IMPLANT
GOWN STRL REUS W/ TWL XL LVL3 (GOWN DISPOSABLE) ×1 IMPLANT
GOWN STRL REUS W/TWL LRG LVL3 (GOWN DISPOSABLE) ×2
GOWN STRL REUS W/TWL XL LVL3 (GOWN DISPOSABLE) ×2
KIT MARKER MARGIN INK (KITS) ×2 IMPLANT
NEEDLE HYPO 25X1 1.5 SAFETY (NEEDLE) ×2 IMPLANT
NS IRRIG 1000ML POUR BTL (IV SOLUTION) ×2 IMPLANT
PACK BASIN DAY SURGERY FS (CUSTOM PROCEDURE TRAY) ×2 IMPLANT
PENCIL SMOKE EVACUATOR (MISCELLANEOUS) ×2 IMPLANT
SLEEVE SCD COMPRESS KNEE MED (STOCKING) ×2 IMPLANT
SPONGE LAP 4X18 RFD (DISPOSABLE) ×2 IMPLANT
SUT MNCRL AB 4-0 PS2 18 (SUTURE) ×2 IMPLANT
SUT VICRYL 3-0 CR8 SH (SUTURE) ×2 IMPLANT
SYR CONTROL 10ML LL (SYRINGE) ×2 IMPLANT
TOWEL GREEN STERILE FF (TOWEL DISPOSABLE) ×2 IMPLANT
TRAY FAXITRON CT DISP (TRAY / TRAY PROCEDURE) ×2 IMPLANT
TUBE CONNECTING 20X1/4 (TUBING) ×2 IMPLANT
YANKAUER SUCT BULB TIP NO VENT (SUCTIONS) ×2 IMPLANT

## 2020-09-07 NOTE — Anesthesia Postprocedure Evaluation (Signed)
Anesthesia Post Note  Patient: Alexandria Casey  Procedure(s) Performed: RIGHT BREAST LUMPECTOMY WITH RADIOACTIVE SEED LOCALIZATION (Right Breast)     Patient location during evaluation: PACU Anesthesia Type: General Level of consciousness: awake and alert Pain management: pain level controlled Vital Signs Assessment: post-procedure vital signs reviewed and stable Respiratory status: spontaneous breathing, nonlabored ventilation and respiratory function stable Cardiovascular status: blood pressure returned to baseline and stable Postop Assessment: no apparent nausea or vomiting Anesthetic complications: no   No complications documented.  Last Vitals:  Vitals:   09/07/20 1124 09/07/20 1145  BP: (!) 111/58 (!) 99/53  Pulse: 67 72  Resp: 11 16  Temp:  36.6 C  SpO2: 100% 100%    Last Pain:  Vitals:   09/07/20 1145  TempSrc:   PainSc: 0-No pain                 Lynda Rainwater

## 2020-09-07 NOTE — Discharge Instructions (Signed)
[place] Office Phone Number [phone]  BREAST BIOPSY/ PARTIAL MASTECTOMY: POST OP INSTRUCTIONS  Always review your discharge instruction sheet given to you by the facility where your surgery was performed.  IF YOU HAVE DISABILITY OR FAMILY LEAVE FORMS, YOU MUST BRING THEM TO THE OFFICE FOR PROCESSING.  DO NOT GIVE THEM TO YOUR DOCTOR.  1. A prescription for pain medication may be given to you upon discharge.  Take your pain medication as prescribed, if needed.  If narcotic pain medicine is not needed, then you may take acetaminophen (Tylenol) or ibuprofen (Advil) as needed. 2. Take your usually prescribed medications unless otherwise directed 3. If you need a refill on your pain medication, please contact your pharmacy.  They will contact our office to request authorization.  Prescriptions will not be filled after 5pm or on week-ends. 4. You should eat very light the first 24 hours after surgery, such as soup, crackers, pudding, etc.  Resume your normal diet the day after surgery. 5. Most patients will experience some swelling and bruising in the breast.  Ice packs and a good support bra will help.  Swelling and bruising can take several days to resolve.  6. It is common to experience some constipation if taking pain medication after surgery.  Increasing fluid intake and taking a stool softener will usually help or prevent this problem from occurring.  A mild laxative (Milk of Magnesia or Miralax) should be taken according to package directions if there are no bowel movements after 48 hours. 7. Unless discharge instructions indicate otherwise, you may remove your bandages 24-48 hours after surgery, and you may shower at that time.  You may have steri-strips (small skin tapes) in place directly over the incision.  These strips should be left on the skin for 7-10 days.  If your surgeon used skin glue on the incision, you may shower in 24 hours.  The glue will flake off over the  next 2-3 weeks.  Any sutures or staples will be removed at the office during your follow-up visit. 8. ACTIVITIES:  You may resume regular daily activities (gradually increasing) beginning the next day.  Wearing a good support bra or sports bra minimizes pain and swelling.  You may have sexual intercourse when it is comfortable. a. You may drive when you no longer are taking prescription pain medication, you can comfortably wear a seatbelt, and you can safely maneuver your car and apply brakes. b. RETURN TO WORK:  ______________________________________________________________________________________ 9. You should see your doctor in the office for a follow-up appointment approximately two weeks after your surgery.  Your doctor's nurse will typically make your follow-up appointment when she calls you with your pathology report.  Expect your pathology report 2-3 business days after your surgery.  You may call to check if you do not hear from [name] after three days. 10. OTHER INSTRUCTIONS: _______________________________________________________________________________________________ _____________________________________________________________________________________________________________________________________ _____________________________________________________________________________________________________________________________________ _____________________________________________________________________________________________________________________________________  WHEN TO CALL YOUR DOCTOR: 1. Fever over 101.0 2. Nausea and/or vomiting. 3. Extreme swelling or bruising. 4. Continued bleeding from incision. 5. Increased pain, redness, or drainage from the incision.  The clinic staff is available to answer your questions during regular business hours.  Please don't hesitate to call and ask to speak to one of the nurses for clinical concerns.  If you have a medical emergency, go to the nearest  emergency room or call 911.  A surgeon from [facility] is always on call at the hospital.  For further questions, please visit [email or web address]  Tumorectoma, cuidados posteriores Lumpectomy, Care After Esta hoja le brinda informacin sobre cmo cuidarse despus del procedimiento. Su mdico tambin podr darle instrucciones ms especficas. Comunquese con el mdico si tiene problemas o preguntas. Qu puedo esperar despus del procedimiento? Despus del procedimiento, es comn [employer] siguientes sntomas:  Hinchazn de las [place].  Dolor a Radiographer, therapeutic.  Rigidez en el brazo o el hombro.  Cambio en la forma y la sensibilidad de las [place].  Tejido cicatricial que se siente duro al tacto en la zona donde se extrajo el ndulo. Siga estas instrucciones en su casa: Medicamentos  [employer] de venta libre y los recetados solamente como se lo haya indicado el mdico.  Si le recetaron un antibitico, tmelo como se lo haya indicado el mdico. No deje de tomar los antibiticos aunque comience a Sports administrator.  Pregntele al mdico si el medicamento recetado: ? Hace necesario que evite conducir o usar maquinaria pesada. ? Puede causarle estreimiento. Es posible que tenga que tomar estas medidas para prevenir o tratar el estreimiento:  Beba suficiente lquido como para Theatre manager la orina de color amarillo plido.  Tome medicamentos recetados o de venta [place].  Consuma alimentos ricos en fibra, como frijoles, cereales integrales, y frutas y verduras frescas.  Limite el consumo de alimentos ricos en grasa y azcares procesados, como alimentos fritos o dulces. Cuidados de la incisin  Siga las instrucciones del mdico acerca del cuidado de la incisin. Asegrese de hacer lo siguiente: ? Lvese las manos con agua y [place] antes y despus de cambiar la venda (vendaje). Use desinfectante para manos si no dispone de [place] y [place]. ? Cambie el  vendaje como se lo haya indicado el mdico. ? No retire los puntos (suturas), la goma para cerrar la piel o las tiras [place]. Es posible que estos cierres cutneos deban quedar puestos en la piel durante 2semanas o ms tiempo. Si los bordes de las tiras adhesivas empiezan a despegarse y Therapist, sports, puede recortar los que estn sueltos. No retire las tiras [facility] por completo a menos que el mdico se lo indique.  Controle la zona de la incisin todos los das para detectar signos de infeccin. Est atenta a los siguientes signos: ? Aumento del enrojecimiento, la hinchazn o Conservation officer, historic buildings. ? Lquido o sangre. ? Calor. ? Pus o mal olor.  Mantenga el vendaje limpio y seco.  Si la enviaron de regreso a su casa con un drenaje quirrgico colocado, siga las indicaciones del mdico sobre cmo vaciarlo.      Baos  No tome baos de inmersin, no nade ni use el jacuzzi hasta que el mdico la autorice.  Pregntele al mdico si puede ducharse. Thurston Pounds solo le permitan darse baos de [place]. Actividad  Haga reposo como se lo haya indicado el mdico.  Evite estar sentada durante largos perodos sin moverse. Levntese y camine un poco cada 1 a 2 horas. Esto es importante para mejorar el flujo sanguneo y la respiracin. Pida ayuda si se siente dbil o inestable.  Retome sus actividades normales segn lo indicado por el mdico. Pregntele al mdico qu actividades son seguras para usted.  Evite cualquier actividad que pueda causarle una lesin en el brazo que est del lado de la [place].  No levante ningn objeto que pese ms de 10libras (4,5kg) o el lmite de peso que le hayan indicado, hasta que el mdico le diga que puede [place]. Evite levantar objetos con el brazo que est del lado  de la [place].  No cargue objetos pesados sobre el hombro del lado de la [place].  Haga ejercicios para evitar que el hombro y el brazo se pongan rgidos y se hinchen. Consulte al [employer] tipos de  ejercicios que son seguros para usted. Instrucciones generales  Use un sostn de soporte como se lo haya indicado su mdico.  Cuando est sentada o acostada, levante (eleve) el brazo por encima del nivel del corazn.  No use anillos, pulseras ni otros accesorios ajustados en el brazo, la [place] o los dedos del lado de la [place].  Concurra a todas las visitas de seguimiento como se lo haya indicado el mdico. Esto es importante. ? Probablemente necesite que le hagan un estudio para determinar la presencia de ms lquido alrededor de los ganglios linfticos e hinchazn en la mama y el brazo (linfedema). Siga las instrucciones del mdico acerca de la frecuencia con la que se debe hacer los controles.  Si le han extrado algn ganglio linftico durante el procedimiento, asegrese de darles toda la informacin a sus mdicos. Esta es una informacin importante que se debe compartir antes de ciertos procedimientos, como anlisis de [place] o medicin de presin arterial. Comunquese con un mdico si:  Presenta una erupcin cutnea.  Tiene fiebre.  Los analgsicos no [employer].  Tiene hinchazn, debilidad o adormecimiento en el brazo que no mejora despus de unas semanas.  Tiene una hinchazn nueva en la mama.  Tiene cualquiera de estos signos de infeccin: ? Ms enrojecimiento, hinchazn o dolor en la zona de la incisin. ? Lquido o sangre que salen de la incisin. ? Calor que proviene de la zona de la incisin. ? Pus o mal olor en TEFL teacher de la incisin. Solicite ayuda inmediatamente si tiene:  Location manager intenso en la mama o el brazo.  Hinchazn en las piernas o los brazos.  Enrojecimiento, calor o dolor en las piernas o los brazos.  Dolor de pecho.  Dificultad para respirar. Resumen  Despus del procedimiento, es comn tener sensibilidad al tacto e hinchazn en la mama, y rigidez en el brazo y [place].  Siga las instrucciones del mdico acerca del cuidado de la  incisin.  No levante ningn objeto que pese ms de 10libras (4,5kg) o el lmite de peso que le hayan indicado, hasta que el mdico le diga que puede [place]. Evite levantar objetos con el brazo que est del lado de la [place].  Si le han extrado algn ganglio linftico durante el procedimiento, asegrese de darles toda la informacin a sus mdicos. Esta es una informacin importante que se debe compartir antes de ciertos procedimientos, como anlisis de [place] o medicin de presin arterial. Esta informacin no tiene Marine scientist el consejo del mdico. Asegrese de hacerle al mdico cualquier pregunta que tenga. Document Revised: 01/21/2019 Document Reviewed: 01/21/2019 Elsevier Patient Education  [place].   May take Tylenol and/or Ibuprofen after 3:40pm, if needed.    Post Anesthesia Home Care Instructions  Activity: Get plenty of rest for the remainder of the day. A responsible individual must stay with you for 24 hours following the procedure.  For the next 24 hours, DO NOT: -Drive a car -Paediatric nurse -Drink alcoholic beverages -Take any medication unless instructed by your physician -Make any legal decisions or sign important papers.  Meals: Start with liquid foods such as gelatin or soup. Progress to regular foods as tolerated. Avoid greasy, spicy, heavy foods. If nausea and/or vomiting occur, drink  only clear liquids until the nausea and/or vomiting subsides. Call your physician if vomiting continues.  Special Instructions/Symptoms: Your throat may feel dry or sore from the anesthesia or the breathing tube placed in your throat during surgery. If this causes discomfort, gargle with warm salt water. The discomfort should disappear within 24 hours.  If you had a scopolamine patch placed behind your ear for the management of post- operative nausea and/or vomiting:  1. The medication in the patch is effective for 72 hours, after which it should be removed.   Wrap patch in a tissue and discard in the trash. Wash hands thoroughly with soap and water. 2. You may remove the patch earlier than 72 hours if you experience unpleasant side effects which may include dry mouth, dizziness or visual disturbances. 3. Avoid touching the patch. Wash your hands with soap and water after contact with the patch.

## 2020-09-07 NOTE — Anesthesia Preprocedure Evaluation (Signed)
Anesthesia Evaluation  Patient identified by MRN, date of birth, ID band Patient awake    Reviewed: Allergy & Precautions, NPO status , Patient's Chart, lab work & pertinent test results  Airway Mallampati: II  TM Distance: >3 FB Neck ROM: Full    Dental no notable dental hx.    Pulmonary neg pulmonary ROS,    Pulmonary exam normal breath sounds clear to auscultation       Cardiovascular negative cardio ROS Normal cardiovascular exam Rhythm:Regular Rate:Normal     Neuro/Psych negative neurological ROS  negative psych ROS   GI/Hepatic Neg liver ROS, GERD  ,  Endo/Other  negative endocrine ROS  Renal/GU negative Renal ROS  negative genitourinary   Musculoskeletal negative musculoskeletal ROS (+)   Abdominal   Peds negative pediatric ROS (+)  Hematology negative hematology ROS (+)   Anesthesia Other Findings   Reproductive/Obstetrics negative OB ROS                             Anesthesia Physical Anesthesia Plan  ASA: II  Anesthesia Plan: General   Post-op Pain Management:    Induction: Intravenous  PONV Risk Score and Plan: 3 and Ondansetron, Dexamethasone, Midazolam and Treatment may vary due to age or medical condition  Airway Management Planned: LMA  Additional Equipment:   Intra-op Plan:   Post-operative Plan: Extubation in OR  Informed Consent: I have reviewed the patients History and Physical, chart, labs and discussed the procedure including the risks, benefits and alternatives for the proposed anesthesia with the patient or authorized representative who has indicated his/her understanding and acceptance.     Dental advisory given  Plan Discussed with: CRNA  Anesthesia Plan Comments:         Anesthesia Quick Evaluation

## 2020-09-07 NOTE — Op Note (Signed)
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  Multiple additional margins taken superior, lateral and inferior which did not contain the clip.  Grossly, the entire area was removed and cut back to normal healthy appearing breast tissue.  Drains: None  IV fluids: Per anesthesia record  Indications for procedure: The patient presents for right breast lumpectomy after core biopsy showed atypical lobular hyperplasia.  She was met with an interpreter to facilitate proper communication.  Risk of potential upgrade risk to a malignant lesion to be up to 30% in the settings of the core biopsy.  She opted for lumpectomy to remove the area for further assessment.  The procedure was explained to her with the help of the translator, complications, long-term expectations and expected recovery.The procedure has been discussed with the patient. Alternatives to surgery have been discussed with the patient.  Risks of surgery include bleeding,  Infection, cosmesis seroma formation, death,  and the need for further surgery.   The patient understands and wishes to proceed.  Description of procedure: The patient was met in the holding area and questions were answered.  Films were available for review and reviewed.  Neoprobe used identify the seed in the right breast upper outer quadrant.  All questions were answered and a translator was available to assist.  She was then taken back to the operative room.  She was placed upon upon the OR table.  After induction of general anesthesia, right breast was prepped and draped in a sterile fashion and timeout performed.  Proper patient, site and procedure were verified.  Curvilinear incision was made along the superior border of the nipple  areolar complex.  Dissection was carried down all tissue around the seed was excised.  The image revealed the seed to be present but the clip was not.  Reviewed the films and this appeared to be more lateral and superior and 2 additional lateral margins as well as a large superior margin were taken.  We also took an inferior margin as well.  None of the specimens revealed the seed.  Grossly, there is no residual dense tissue or anything to suggest any residual disease.  Given her breast size and the amount of tissue removed, I did not feel prudent to remove anything else at this point time and to send all for assessment.  Cavity is irrigated.  Vancomycin powder was placed after ensuring hemostasis.  It was then closed the deep layer 3-0 Vicryl and 4 Monocryl.  Dermabond was applied.  All counts were found to be correct.  Breast binder placed.  Patient was awoke extubated taken to recovery in satisfactory condition.

## 2020-09-07 NOTE — Transfer of Care (Signed)
Immediate Anesthesia Transfer of Care Note  Patient: Alexandria Casey  Procedure(s) Performed: RIGHT BREAST LUMPECTOMY WITH RADIOACTIVE SEED LOCALIZATION (Right Breast)  Patient Location: PACU  Anesthesia Type:General  Level of Consciousness: drowsy and patient cooperative  Airway & Oxygen Therapy: Patient Spontanous Breathing and Patient connected to face mask oxygen  Post-op Assessment: Report given to RN and Post -op Vital signs reviewed and stable  Post vital signs: Reviewed and stable  Last Vitals:  Vitals Value Taken Time  BP 112/53 09/07/20 1100  Temp    Pulse 64 09/07/20 1101  Resp 13 09/07/20 1101  SpO2 100 % 09/07/20 1101  Vitals shown include unvalidated device data.  Last Pain:  Vitals:   09/07/20 0832  TempSrc: Oral  PainSc: 0-No pain      Patients Stated Pain Goal: 4 ([id number])  Complications: No complications documented.

## 2020-09-07 NOTE — Anesthesia Procedure Notes (Signed)
Procedure Name: LMA Insertion Date/Time: 09/07/2020 11:02 AM Performed by: Kathryne Hitch, CRNA Pre-anesthesia Checklist: Patient identified, Emergency Drugs available, Suction available and Patient being monitored Patient Re-evaluated:Patient Re-evaluated prior to induction Oxygen Delivery Method: Circle system utilized Preoxygenation: Pre-oxygenation with 100% oxygen Induction Type: IV induction Ventilation: Mask ventilation without difficulty LMA: LMA inserted LMA Size: 4.0 Number of attempts: 1 Placement Confirmation: positive ETCO2 Tube secured with: Tape Dental Injury: Teeth and Oropharynx as per pre-operative assessment

## 2020-09-07 NOTE — Interval H&P Note (Signed)
History and Physical Interval Note:  09/07/2020 9:24 AM  Alexandria Casey  has presented today for surgery, with the diagnosis of [place].  The various methods of treatment have been discussed with the patient and family. After consideration of risks, benefits and other options for treatment, the patient has consented to  Procedure(s) with comments: RIGHT BREAST LUMPECTOMY WITH RADIOACTIVE SEED LOCALIZATION (Right) - START TIME OF 9:45 AM FOR 60 MINUTES ROOM 8 as a surgical intervention.  The patient's history has been reviewed, patient examined, no change in status, stable for surgery.  I have reviewed the patient's chart and labs.  Questions were answered to the patient's satisfaction.     [place]

## 2020-09-08 ENCOUNTER — Encounter (HOSPITAL_BASED_OUTPATIENT_CLINIC_OR_DEPARTMENT_OTHER): Payer: Self-pay | Admitting: Surgery

## 2020-09-12 LAB — SURGICAL PATHOLOGY

## 2020-10-26 ENCOUNTER — Telehealth: Payer: Self-pay | Admitting: *Deleted

## 2020-10-26 NOTE — Telephone Encounter (Signed)
Per Dr. Brantley Stage a diagnostic mammogram is recommended in year prior to her follow-up visit the first week of April 2023.

## 2021-09-11 DIAGNOSIS — H11052 Peripheral pterygium, progressive, left eye: Secondary | ICD-10-CM | POA: Insufficient documentation

## 2021-09-17 DIAGNOSIS — Z9889 Other specified postprocedural states: Secondary | ICD-10-CM | POA: Insufficient documentation

## 2021-10-02 ENCOUNTER — Encounter (HOSPITAL_COMMUNITY): Payer: Self-pay | Admitting: Emergency Medicine

## 2021-10-02 ENCOUNTER — Emergency Department (HOSPITAL_COMMUNITY)
Admission: EM | Admit: 2021-10-02 | Discharge: 2021-10-02 | Disposition: A | Discharge status: Home / Self Care | Payer: Self-pay | Attending: Emergency Medicine | Admitting: Emergency Medicine

## 2021-10-02 ENCOUNTER — Emergency Department (HOSPITAL_COMMUNITY): Payer: Self-pay

## 2021-10-02 ENCOUNTER — Other Ambulatory Visit: Payer: Self-pay

## 2021-10-02 DIAGNOSIS — M5441 Lumbago with sciatica, right side: Secondary | ICD-10-CM | POA: Insufficient documentation

## 2021-10-02 DIAGNOSIS — R1084 Generalized abdominal pain: Secondary | ICD-10-CM | POA: Insufficient documentation

## 2021-10-02 DIAGNOSIS — M5431 Sciatica, right side: Secondary | ICD-10-CM

## 2021-10-02 DIAGNOSIS — R140 Abdominal distension (gaseous): Secondary | ICD-10-CM | POA: Insufficient documentation

## 2021-10-02 LAB — URINALYSIS, ROUTINE W REFLEX MICROSCOPIC
Bilirubin Urine: NEGATIVE
Glucose, UA: NEGATIVE mg/dL
Ketones, ur: 5 mg/dL — AB
Leukocytes,Ua: NEGATIVE
Nitrite: NEGATIVE
Protein, ur: NEGATIVE mg/dL
Specific Gravity, Urine: 1.02 (ref 1.005–1.030)
pH: 6 (ref 5.0–8.0)

## 2021-10-02 LAB — COMPREHENSIVE METABOLIC PANEL
ALT: 48 U/L — ABNORMAL HIGH (ref 0–44)
AST: 29 U/L (ref 15–41)
Albumin: 4.5 g/dL (ref 3.5–5.0)
Alkaline Phosphatase: 66 U/L (ref 38–126)
Anion gap: 10 (ref 5–15)
BUN: 12 mg/dL (ref 6–20)
CO2: 19 mmol/L — ABNORMAL LOW (ref 22–32)
Calcium: 9.8 mg/dL (ref 8.9–10.3)
Chloride: 107 mmol/L (ref 98–111)
Creatinine, Ser: 0.56 mg/dL (ref 0.44–1.00)
GFR, Estimated: >60 mL/min (ref >60)
Glucose, Bld: 103 mg/dL — ABNORMAL HIGH (ref 70–99)
Potassium: 3.7 mmol/L (ref 3.5–5.1)
Sodium: 136 mmol/L (ref 135–145)
Total Bilirubin: 0.9 mg/dL (ref 0.3–1.2)
Total Protein: 8.1 g/dL (ref 6.5–8.1)

## 2021-10-02 LAB — CBC WITH DIFFERENTIAL/PLATELET
Abs Immature Granulocytes: 0.01 10*3/uL (ref 0.00–0.07)
Basophils Absolute: 0.1 10*3/uL (ref 0.0–0.1)
Basophils Relative: 1 %
Eosinophils Absolute: 0.1 10*3/uL (ref 0.0–0.5)
Eosinophils Relative: 1 %
HCT: 43.2 % (ref 36.0–46.0)
Hemoglobin: 14.7 g/dL (ref 12.0–15.0)
Immature Granulocytes: 0 %
Lymphocytes Relative: 42 %
Lymphs Abs: 2.8 10*3/uL (ref 0.7–4.0)
MCH: 28.3 pg (ref 26.0–34.0)
MCHC: 34 g/dL (ref 30.0–36.0)
MCV: 83.1 fL (ref 80.0–100.0)
Monocytes Absolute: 0.4 10*3/uL (ref 0.1–1.0)
Monocytes Relative: 6 %
Neutro Abs: 3.3 10*3/uL (ref 1.7–7.7)
Neutrophils Relative %: 50 %
Platelets: 317 10*3/uL (ref 150–400)
RBC: 5.2 MIL/uL — ABNORMAL HIGH (ref 3.87–5.11)
RDW: 13.2 % (ref 11.5–15.5)
WBC: 6.7 10*3/uL (ref 4.0–10.5)
nRBC: 0 % (ref 0.0–0.2)

## 2021-10-02 LAB — PREGNANCY, URINE: Preg Test, Ur: NEGATIVE

## 2021-10-02 LAB — LIPASE, BLOOD: Lipase: 34 U/L (ref 11–51)

## 2021-10-02 MED ORDER — POLYETHYLENE GLYCOL 3350 17 G PO PACK: 17.0000 g | PACK | Freq: Every day | ORAL | 0 refills | Status: DC

## 2021-10-02 MED ORDER — PREDNISONE 50 MG PO TABS: ORAL_TABLET | ORAL | 0 refills | Status: DC

## 2021-10-02 MED ORDER — IOHEXOL 300 MG/ML  SOLN
100.0000 mL | Freq: Once | INTRAMUSCULAR | Status: AC | PRN
  Administered 2021-10-02: 100 mL via INTRAVENOUS

## 2021-10-02 NOTE — ED Provider Notes (Signed)
?[facility] ?Provider Note ? ? ?CSN: [id number] ?Arrival date & time: 10/02/21  4010 ? ?  ? ?History ? ?Chief Complaint  ?Patient presents with  ? Abdominal Pain  ? ? ?Alexandria Casey is a 46 y.o. female. ? ?Pt complains of abdominal pain and swelling for the past 2 months.  Pt has also had low back pain on the right side that radiates down her leg.  Pt thinks she has sciatica.  Pt reports constipation.  Pt reports when she eats she feels bloated.  Pt reports back discomfort and abdominal discomfort started at about the same time.  Pt reports yesterday she began feeling nauseated  ? ?The history is provided by the patient. No language interpreter was used.  ?Abdominal Pain ?Pain location:  RLQ and suprapubic ?Pain quality: aching   ?Pain radiates to:  R flank ?Pain severity:  Moderate ?Onset quality:  Gradual ?Duration:  8 weeks ?Timing:  Constant ?Progression:  Worsening ?Chronicity:  New ?Context: not recent sexual activity, not sick contacts and not suspicious food intake   ?Relieved by:  Nothing ?Worsened by:  Nothing ?Ineffective treatments:  None tried ?Associated symptoms: nausea   ?Associated symptoms: no anorexia   ?Risk factors: has not had multiple surgeries and not pregnant   ? ?  ? ?Home Medications ?Prior to Admission medications   ?Medication Sig Start Date End Date Taking? Authorizing Provider  ?Cetirizine HCl 10 MG CAPS Take 1 capsule (10 mg total) by mouth daily for 10 days. 04/11/20 04/21/20  Wieters, Hallie C, PA-C  ?fluticasone (FLONASE) 50 MCG/ACT nasal spray Place 1-2 sprays into both nostrils daily. 04/11/20   Wieters, Hallie C, PA-C  ?ibuprofen (ADVIL) 800 MG tablet Take 1 tablet (800 mg total) by mouth 3 (three) times daily. 04/11/20   Wieters, Hallie C, PA-C  ?ibuprofen (ADVIL) 800 MG tablet Take 1 tablet (800 mg total) by mouth every 8 (eight) hours as needed. 09/07/20   Cornett, [name], MD  ?oxyCODONE (OXY IR/ROXICODONE) 5 MG immediate  release tablet Take 1 tablet (5 mg total) by mouth every 6 (six) hours as needed for severe pain. 09/07/20   Erroll Luna, MD  ?desogestrel-ethinyl estradiol (APRI,EMOQUETTE,SOLIA) 0.15-30 MG-MCG tablet Take 1 tablet by mouth daily. 01/13/15 04/11/20  Dickie La, MD  ?levonorgestrel (MIRENA) 20 MCG/24HR IUD 1 each by Intrauterine route once.    04/11/20  [provider]  ?norgestimate-ethinyl estradiol (ORTHO-CYCLEN,SPRINTEC,PREVIFEM) 0.25-35 MG-MCG tablet Take 1 tablet by mouth daily. 03/22/14 04/11/20  Frazier Richards, MD  ?Norgestimate-Ethinyl Estradiol Triphasic (TRI-SPRINTEC) 0.18/0.215/0.25 MG-35 MCG tablet Take 1 tablet by mouth daily. 01/13/15 04/11/20  Dickie La, MD  ?ranitidine (ZANTAC) 75 MG tablet Take 1 tablet (75 mg total) by mouth daily. 01/09/12 04/11/20  Boykin Nearing, MD  ?Simethicone 180 MG CAPS Take 1 capsule (180 mg total) by mouth 3 (three) times daily as needed (gas/flatulance/bloating). 12/01/14 04/11/20  Hilton Sinclair, MD  ?   ? ?Allergies    ?Doxycycline   ? ?Review of Systems   ?Review of Systems  ?Gastrointestinal:  Positive for abdominal pain and nausea. Negative for anorexia.  ?All other systems reviewed and are negative. ? ?Physical Exam ?Updated Vital Signs ?BP (!) 108/53   Pulse 66   Temp 97.8 ?F (36.6 ?C) (Oral)   Resp 16   SpO2 99%  ?Physical Exam ?Vitals and nursing note reviewed.  ?Constitutional:   ?   Appearance: She is well-developed.  ?HENT:  ?   Head:  Normocephalic.  ?Cardiovascular:  ?   Rate and Rhythm: Normal rate and regular rhythm.  ?   Heart sounds: Normal heart sounds.  ?Pulmonary:  ?   Effort: Pulmonary effort is normal.  ?Abdominal:  ?   General: Abdomen is flat. Bowel sounds are normal. There is no distension.  ?   Palpations: Abdomen is soft.  ?   Tenderness: There is generalized abdominal tenderness.  ?   Hernia: No hernia is present.  ?Musculoskeletal:     ?   General: Normal range of motion.  ?   Cervical back: Normal range of motion.   ?Skin: ?   General: Skin is warm.  ?Neurological:  ?   Mental Status: She is alert and oriented to person, place, and time.  ? ? ?ED Results / Procedures / Treatments   ?Labs ?(all labs ordered are listed, but only abnormal results are displayed) ?Labs Reviewed  ?CBC WITH DIFFERENTIAL/PLATELET - Abnormal; Notable for the following components:  ?    Result Value  ? RBC 5.20 (*)   ? All other components within normal limits  ?COMPREHENSIVE METABOLIC PANEL - Abnormal; Notable for the following components:  ? CO2 19 (*)   ? Glucose, Bld 103 (*)   ? ALT 48 (*)   ? All other components within normal limits  ?URINALYSIS, ROUTINE W REFLEX MICROSCOPIC - Abnormal; Notable for the following components:  ? APPearance HAZY (*)   ? Hgb urine dipstick SMALL (*)   ? Ketones, ur 5 (*)   ? Bacteria, UA FEW (*)   ? All other components within normal limits  ?LIPASE, BLOOD  ?PREGNANCY, URINE  ? ? ?EKG ?None ? ?Radiology ?CT ABDOMEN PELVIS W CONTRAST ? ?Result Date: 10/02/2021 ?CLINICAL DATA:  Right lower quadrant abdominal pain. EXAM: CT ABDOMEN AND PELVIS WITH CONTRAST TECHNIQUE: Multidetector CT imaging of the abdomen and pelvis was performed using the standard protocol following bolus administration of intravenous contrast. RADIATION DOSE REDUCTION: This exam was performed according to the departmental dose-optimization program which includes automated exposure control, adjustment of the mA and/or kV according to patient size and/or use of iterative reconstruction technique. CONTRAST:  117m OMNIPAQUE IOHEXOL 300 MG/ML  SOLN COMPARISON:  CT June 22, 2008 FINDINGS: Lower chest: No acute abnormality. Hepatobiliary: Diffuse hepatic steatosis. Focal fatty sparing along the gallbladder fossa. Gallbladder is unremarkable. No biliary ductal dilation. Pancreas: No pancreatic ductal dilation or evidence of acute inflammation. Spleen: No splenomegaly or focal splenic lesion. Adrenals/Urinary Tract: Bilateral adrenal glands appear normal. No  hydronephrosis. Kidneys demonstrate symmetric enhancement and excretion of contrast material. Urinary bladder is unremarkable. Stomach/Bowel: No enteric contrast was administered. Stomach is unremarkable for degree of distension. No pathologic dilation of small or large bowel. The appendix appears normal. Scattered left-sided colonic diverticulosis without findings of acute diverticulitis. Vascular/Lymphatic: Normal caliber abdominal aorta. No pathologically enlarged abdominal or pelvic lymph nodes. Prominent retroperitoneal and mesenteric lymph nodes measure up to 5 mm in short axis, similar to prior CT. Reproductive: Uterus and bilateral adnexa are unremarkable in CT appearance for a reproductive age female. Other: No significant abdominopelvic free fluid. Musculoskeletal: No acute osseous findings. IMPRESSION: 1. No acute abdominopelvic findings. Normal appendix. 2. Diffuse hepatic steatosis. 3. Scattered left-sided colonic diverticulosis without findings of acute diverticulitis. Electronically Signed   By: JDahlia BailiffM.D.   On: 10/02/2021 09:48   ? ?Procedures ?Procedures  ? ? ?Medications Ordered in ED ?Medications  ?iohexol (OMNIPAQUE) 300 MG/ML solution 100 mL (100 mLs Intravenous Contrast Given  10/02/21 0941)  ? ? ?ED Course/ Medical Decision Making/ A&P ?  ?                        ?Medical Decision Making ?Pt complains of abdominal pain for the past 2 months   ? ?Amount and/or Complexity of Data Reviewed ?Independent Historian:  ?   Details: Daughter is with pt ?Labs: ordered. Decision-making details documented in ED Course. ?   Details: cbc  shows a normal wbc count.  chemistry shows normal renal functions and liver functions ?Radiology: ordered and independent interpretation performed. Decision-making details documented in ED Course. ?   Details: Ct scan reviewed  no acute findings. ? ?Risk ?Prescription drug management. ?Risk Details: Pt right sided sciatica.  Pt and daughter counseled on Ct results.   I will try pt on miralax to see if this helps with symptoms.  Pt is advised to follow up with Gi for further evaltuion of abdominal discomfort.  I will try pt on short course of prednisone for sciatica  ? ? ?

## 2021-10-02 NOTE — ED Provider Triage Note (Signed)
Emergency Medicine Provider Triage Evaluation Note ? ?[name] , a 46 y.o. female  was evaluated in triage.  Pt complains of right lower quadrant pain x2 days.  Patient states that originally her pain started in her umbilicus and then traveled to her right lower quadrant.  She states she has felt sharp stabbing pains, although currently she is not experiencing pain.  She feels like her abdomen is very full.  She has had no measured fevers but has felt significant chills.  She has had nausea but no vomiting.  Denies any urinary symptoms.  No prior history of pain like this.  She does still have her appendix. ?Review of Systems  ?Positive: As above ?Negative: As above ? ?Physical Exam  ?BP 120/68 (BP Location: Right Arm)   Pulse 98   Temp 98.9 ?F (37.2 ?C) (Oral)   Resp 17   SpO2 98%  ?Gen:   Awake, no distress   ?Resp:  Normal effort  ?MSK:   Moves extremities without difficulty  ?Other:  Positive McBurney's sign with significant right lower quadrant tenderness.  No CVA tenderness. ? ?Medical Decision Making  ?Medically screening exam initiated at 5:56 AM.  Appropriate orders placed.  [name] was informed that the remainder of the evaluation will be completed by another provider, this initial triage assessment does not replace that evaluation, and the importance of remaining in the ED until their evaluation is complete. ? ? ?  ?Garald Balding, PA-C ?10/02/21 0557 ? ?

## 2021-10-02 NOTE — ED Triage Notes (Signed)
Pt reported to ED with c/o RLQ abdominal pain that began last night. Endorses nausea, fever and chills.  ?

## 2021-11-01 ENCOUNTER — Encounter (HOSPITAL_COMMUNITY): Payer: Self-pay

## 2021-12-29 DIAGNOSIS — H524 Presbyopia: Secondary | ICD-10-CM | POA: Insufficient documentation

## 2021-12-29 DIAGNOSIS — H11041 Peripheral pterygium, stationary, right eye: Secondary | ICD-10-CM | POA: Insufficient documentation

## 2022-02-12 ENCOUNTER — Other Ambulatory Visit: Payer: Self-pay

## 2022-02-12 DIAGNOSIS — Z9889 Other specified postprocedural states: Secondary | ICD-10-CM

## 2022-02-12 DIAGNOSIS — Z853 Personal history of malignant neoplasm of breast: Secondary | ICD-10-CM

## 2022-03-22 ENCOUNTER — Other Ambulatory Visit: Payer: Self-pay | Admitting: Obstetrics and Gynecology

## 2022-03-22 ENCOUNTER — Ambulatory Visit
Admission: RE | Admit: 2022-03-22 | Discharge: 2022-03-22 | Disposition: A | Discharge status: Home / Self Care | Payer: No Typology Code available for payment source | Source: Ambulatory Visit | Attending: Obstetrics and Gynecology | Admitting: Obstetrics and Gynecology

## 2022-03-22 ENCOUNTER — Ambulatory Visit
Admission: RE | Admit: 2022-03-22 | Discharge: 2022-03-22 | Disposition: A | Discharge status: Home / Self Care | Payer: Self-pay | Source: Ambulatory Visit | Attending: Obstetrics and Gynecology | Admitting: Obstetrics and Gynecology

## 2022-03-22 ENCOUNTER — Ambulatory Visit: Payer: Self-pay | Admitting: Hematology and Oncology

## 2022-03-22 VITALS — BP 119/87 | Wt 161.0 lb

## 2022-03-22 DIAGNOSIS — Z9889 Other specified postprocedural states: Secondary | ICD-10-CM

## 2022-03-22 DIAGNOSIS — R599 Enlarged lymph nodes, unspecified: Secondary | ICD-10-CM

## 2022-03-22 DIAGNOSIS — N644 Mastodynia: Secondary | ICD-10-CM

## 2022-03-22 DIAGNOSIS — N6489 Other specified disorders of breast: Secondary | ICD-10-CM

## 2022-03-22 NOTE — Progress Notes (Signed)
[name] is a 46 y.o. female who presents to [facility] clinic today with complaint of right breast pain.    Pap Smear: Pap not smear completed today. Last Pap smear was 12/21 at [facility] clinic and was normal. Per patient has no history of an abnormal Pap smear. Last Pap smear result is available in Epic.   Physical exam: Breasts Breasts symmetrical. No skin abnormalities bilateral breasts. No nipple retraction bilateral breasts. No nipple discharge bilateral breasts. No lymphadenopathy. No lumps palpated bilateral breasts. Old lumpectomy site noted to right breast. MM Breast Surgical Specimen  Result Date: 09/07/2020 CLINICAL DATA:  Evaluate surgical specimen following excision of RIGHT breast ALH. EXAM: SPECIMEN RADIOGRAPH OF THE RIGHT BREAST COMPARISON:  Previous exam(s). FINDINGS: Status post excision of the RIGHT breast. The radioactive seed is present and completely intact. The COIL clip is not visualized within the specimen and the clinical team notified in the OR. Additional tissue was obtained along SUPERIOR and LATERAL aspects. IMPRESSION: Specimen radiograph of the RIGHT breast. The COIL clip is not visualized with information related to the clinical team. Electronically Signed   By: Margarette Canada M.D.   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 Radioactive seed localization is performed in anticipation of excisional biopsy. EXAM: MAMMOGRAPHIC GUIDED RADIOACTIVE SEED LOCALIZATION OF THE RIGHT BREAST COMPARISON:  Previous exam(s). FINDINGS: Patient presents for radioactive seed localization prior to RIGHT breast excisional biopsy. I met with the patient and we discussed the procedure of seed localization including benefits and alternatives. We discussed the high likelihood of a successful  procedure. We discussed the risks of the procedure including infection, bleeding, tissue injury and further surgery. We discussed the low dose of radioactivity involved in the procedure. Informed, written consent was given. The usual time-out protocol was performed immediately prior to the procedure. Using mammographic guidance, sterile technique with chlorhexidine as skin antisepsis, 1% lidocaine as local anesthetic, an I-125 radioactive seed was used to localize the coil shaped tissue marking clip associated with the residual calcifications in the UPPER OUTER QUADRANT of the RIGHT breast using a lateral approach. The follow-up mammogram images confirm the seed is appropriately positioned immediately adjacent to the clip and residual calcifications. The images are marked for Dr. Brantley Stage. Follow-up survey of the patient confirms the presence of the radioactive seed. Order number of I-125 seed: [id number] Total activity: 0.242 mCi Reference Date: 08/18/2020 The patient tolerated the procedure well and was released from the [facility]. She was given instructions regarding seed removal. 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 No apparent complications. Electronically Signed   By: Evangeline Dakin M.D.   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 This was found to be concordant by Dr. Lovey Newcomer, with excision recommended. Pathology results were discussed with the patient by telephone by Kathrine Haddock, Bilingual Patient Services Representative. The patient  reported doing well after the biopsy with tenderness at the site. Post biopsy instructions and care were reviewed and  questions were answered. The patient was encouraged to call [facility] for any additional concerns. Surgical consultation has been arranged with [facility]. Erroll Luna at [facility] on August 10, 2020. Pathology results reported by Terie Purser, RN on 07/15/2020. Electronically Signed   By: Lovey Newcomer M.D.   On: 07/15/2020 14:42   Result Date: 07/15/2020 CLINICAL DATA:  Patient with indeterminate right breast calcifications. EXAM: RIGHT BREAST STEREOTACTIC CORE NEEDLE BIOPSY COMPARISON:  Previous exams. FINDINGS: The patient and I discussed the procedure of stereotactic-guided biopsy including benefits and alternatives. We discussed the high likelihood of a successful procedure. We discussed the risks of the procedure including infection, bleeding, tissue injury, clip migration, and inadequate sampling. Informed written consent was given. The usual time out protocol was performed immediately prior to the procedure. Using sterile technique and 1% Lidocaine as local anesthetic, under stereotactic guidance, a 9 gauge vacuum assisted device was used to perform core needle biopsy of calcifications within the upper-outer right breast anterior depth using a cranial approach. Specimen radiograph was performed showing calcifications. Specimens with calcifications are identified for pathology. Lesion quadrant: Upper outer quadrant At the conclusion of the procedure, coil shaped tissue marker clip was deployed into the biopsy cavity. Follow-up 2-view mammogram was performed and dictated separately. IMPRESSION: Stereotactic-guided biopsy of right breast calcifications. No apparent complications. Electronically Signed: By: Lovey Newcomer M.D. On: 07/13/2020 11:39   MM CLIP PLACEMENT RIGHT  Result Date: 07/13/2020 CLINICAL DATA:  Patient status post stereotactic guided  core needle biopsy right breast calcifications. EXAM: DIAGNOSTIC RIGHT MAMMOGRAM POST STEREOTACTIC BIOPSY COMPARISON:  Previous exam(s). FINDINGS: Mammographic images were obtained following stereotactic guided biopsy of right breast calcifications. The biopsy marking clip is in expected position at the site of biopsy. IMPRESSION: Appropriate positioning of the coil shaped biopsy marking clip at the site of biopsy in the upper outer right breast. Final Assessment: Post Procedure Mammograms for Marker Placement Electronically Signed   By: Lovey Newcomer M.D.   On: 07/13/2020 11:41   MS DIGITAL DIAG UNI RIGHT  Result Date: 06/30/2020 CLINICAL DATA:  Screening recall for right breast calcifications. EXAM: DIGITAL DIAGNOSTIC RIGHT MAMMOGRAM WITH CAD COMPARISON:  Previous exam(s). ACR Breast Density Category c: The breast tissue is heterogeneously dense, which may obscure small masses. FINDINGS: In the upper-outer quadrant of the right breast, middle to anterior depth, there is a 1.1 cm group of amorphous calcifications. Mammographic images were processed with CAD. IMPRESSION: Indeterminate 1.1 cm group of calcifications in the upper-outer right breast. RECOMMENDATION: Stereotactic biopsy is recommended, and has been scheduled for 07/13/2020 at 10:30 a.m. I have discussed the findings and recommendations with the patient. If applicable, a reminder letter will be sent to the patient regarding the next appointment. BI-RADS CATEGORY  4: Suspicious. Electronically Signed   By: [name] M.D.   On: 06/30/2020 12:59   MS DIGITAL SCREENING TOMO BILATERAL  Result Date: 06/09/2020 CLINICAL DATA:  Screening. EXAM: DIGITAL SCREENING BILATERAL MAMMOGRAM WITH TOMO AND CAD COMPARISON:  None. ACR Breast Density Category c: The breast tissue is heterogeneously dense, which may obscure small masses. FINDINGS: In the right breast, calcifications warrant further evaluation with magnified views. In the left breast, no findings  suspicious for malignancy. Images were processed with CAD. IMPRESSION: Further evaluation is suggested for calcifications in the right breast. RECOMMENDATION: Diagnostic mammogram of the right breast. (Code:[id number]) The patient will be contacted regarding the findings, and additional imaging will be scheduled. BI-RADS CATEGORY  0:  Incomplete. Need additional imaging evaluation and/or prior mammograms for comparison. Electronically Signed   By: [name] M.D.   On: 06/09/2020 12:01      Pelvic/Bimanual Pap is not indicated today    Smoking History: Patient has never smoked and was not referred to quit line.    Patient Navigation: Patient education provided. Access to services provided for patient through [place] interpreter provided. No transportation provided   Colorectal Cancer Screening: Per patient has never had colonoscopy completed No complaints today.    Breast and Cervical Cancer Risk Assessment: Patient does not have family history of breast cancer, known genetic mutations, or radiation treatment to the chest before age 75. Patient does not have history of cervical dysplasia, immunocompromised, or DES exposure in-utero.  Risk Assessment   No risk assessment data for the current encounter  Risk Scores       06/30/2020   Last edited by: Royston Bake, CMA   5-year risk: 0.4 %   Lifetime risk: 6.3 %            A: BCCCP exam without pap smear Complaint of right breast pain x 1 week not associated with an old lumpectomy site.   P: Referred patient to the [facility] for a diagnostic mammogram. Appointment scheduled 03/22/22.  Dayton Scrape A, NP 03/22/2022 9:10 AM

## 2022-05-08 ENCOUNTER — Other Ambulatory Visit: Payer: Self-pay

## 2022-05-08 ENCOUNTER — Ambulatory Visit (INDEPENDENT_AMBULATORY_CARE_PROVIDER_SITE_OTHER): Payer: Self-pay | Admitting: Obstetrics and Gynecology

## 2022-05-08 ENCOUNTER — Other Ambulatory Visit (HOSPITAL_COMMUNITY)
Admission: RE | Admit: 2022-05-08 | Discharge: 2022-05-08 | Disposition: A | Discharge status: Home / Self Care | Payer: Self-pay | Source: Ambulatory Visit | Attending: Obstetrics and Gynecology | Admitting: Obstetrics and Gynecology

## 2022-05-08 ENCOUNTER — Encounter: Payer: Self-pay | Admitting: Obstetrics and Gynecology

## 2022-05-08 VITALS — BP 116/62 | HR 65 | Ht 62.0 in | Wt 170.5 lb

## 2022-05-08 DIAGNOSIS — Z3202 Encounter for pregnancy test, result negative: Secondary | ICD-10-CM

## 2022-05-08 DIAGNOSIS — N939 Abnormal uterine and vaginal bleeding, unspecified: Secondary | ICD-10-CM | POA: Insufficient documentation

## 2022-05-08 DIAGNOSIS — R102 Pelvic and perineal pain: Secondary | ICD-10-CM

## 2022-05-08 LAB — POCT PREGNANCY, URINE: Preg Test, Ur: NEGATIVE

## 2022-05-08 MED ORDER — NORETHINDRONE 0.35 MG PO TABS: 1.0000 | ORAL_TABLET | Freq: Every day | ORAL | 11 refills | Status: DC

## 2022-05-08 NOTE — Progress Notes (Unsigned)
NEW GYNECOLOGY PATIENT Patient name: Alexandria Casey MRN [id number]  Date of birth: Jun 28, 1975 Chief Complaint:   abnormal uterine bleeding     History:  Alexandria Casey is a 46 y.o. G4P0 being seen today for AUB and pain.    Irergular bleeding after stopping her birth control a year ago. Previously q28 dy now "whenever". She had been on birth control pill for 10 years to prevent pregnancy.  Prior to birth control her menses were painful and irregular.  Since being off of birth control she has had increased bleeding, pain, general "inflammation", and bloating.  She has been using tampon and pad at the same time, regular tampons through which she feels and leaks onto the pad.  She has no bleeding in between her menses.  Right now her cycles are 25 to 28 days, lasting 4 to 8 days, with 1 menses lasting 18 days in July, and occasionally 2 menses in 1 month.  Pain she is feeling is colicky, and in her back, and inflammation/bloating in her upper abdomen.  She also reports having pain that starts in her back and goes to the front or in her forehead and goes to the back on the right side.  She notes feeling a ball in her right lower back that has been there for at least 4 years.  She has pain in her lower abdomen constantly, that worsens with her menses.  She occasionally has constipation, but without blood.  Suppression has burning with urination, but without clear pattern.  She initially had been told to stop the pills by the health department, but also noticed that her mood was better without the medication onboard, so did not restart.  She has previously used a Corporate treasurer for 1 month, but had a lot of inflammation in bleeding and so had it removed. NO w longer and heavier as well as lower abdominal pain that worsens with peiods. RLQ that radiates to her back. Using condoms for contraception at this time.        Review of Systems  All other systems reviewed and are negative.       Gynecologic History Patient's last menstrual period was 04/28/2022 (exact date). Contraception: condoms Last Pap: 05/2020. Result was normal with negative HPV Last Mammogram: 03/2022.  Result was BIRAD 3 - probably benign  Last Colonoscopy: n/a.    Obstetric History OB History  Gravida Para Term Preterm AB Living  '4 3 3   1 3  '$ SAB IAB Ectopic Multiple Live Births  1       3    # Outcome Date GA Lbr Len/2nd Weight Sex Delivery Anes PTL Lv  4 Term 05/09/10   7 lb (3.175 kg) M Vag-Spont   LIV  3 Term 05/04/03   7 lb (3.175 kg) M Vag-Spont   LIV  2 Term 01/26/99   7 lb (3.175 kg) F Vag-Spont   LIV  1 SAB 1998             Birth Comments: lost baby at 33 months GA    Past Medical History:  Diagnosis Date   ABNORMAL MATERNAL GLUCOSE TOLERANCE ANTEPARTUM 10/26/2009   Qualifier: Diagnosis of  By: Martinique, Bonnie     IUD (intrauterine device) in place 08/26/10   Low blood pressure     Past Surgical History:  Procedure Laterality Date   BREAST LUMPECTOMY WITH RADIOACTIVE SEED LOCALIZATION Right 09/07/2020   Procedure: RIGHT BREAST LUMPECTOMY WITH RADIOACTIVE SEED LOCALIZATION;  Surgeon:  Erroll Luna, MD;  Location: [facility];  Service: General;  Laterality: Right;    Current Outpatient Medications on File Prior to Visit  Medication Sig Dispense Refill   ibuprofen (ADVIL) 800 MG tablet Take 1 tablet (800 mg total) by mouth 3 (three) times daily. 21 tablet 0   ibuprofen (ADVIL) 800 MG tablet Take 1 tablet (800 mg total) by mouth every 8 (eight) hours as needed. 30 tablet 0   Cetirizine HCl 10 MG CAPS Take 1 capsule (10 mg total) by mouth daily for 10 days. 10 capsule 0   fluticasone (FLONASE) 50 MCG/ACT nasal spray Place 1-2 sprays into both nostrils daily. (Patient not taking: Reported on 03/22/2022) 16 g 0   oxyCODONE (OXY IR/ROXICODONE) 5 MG immediate release tablet Take 1 tablet (5 mg total) by mouth every 6 (six) hours as needed for severe pain. (Patient not taking:  Reported on 03/22/2022) 15 tablet 0   polyethylene glycol (MIRALAX) 17 g packet Take 17 g by mouth daily. (Patient not taking: Reported on 03/22/2022) 14 each 0   predniSONE (DELTASONE) 50 MG tablet One tablet a day (Patient not taking: Reported on 03/22/2022) 5 tablet 0   [DISCONTINUED] desogestrel-ethinyl estradiol (APRI,EMOQUETTE,SOLIA) 0.15-30 MG-MCG tablet Take 1 tablet by mouth daily. 3 Package 3   [DISCONTINUED] levonorgestrel (MIRENA) 20 MCG/24HR IUD 1 each by Intrauterine route once.       [DISCONTINUED] norgestimate-ethinyl estradiol (ORTHO-CYCLEN,SPRINTEC,PREVIFEM) 0.25-35 MG-MCG tablet Take 1 tablet by mouth daily. 1 Package 11   [DISCONTINUED] Norgestimate-Ethinyl Estradiol Triphasic (TRI-SPRINTEC) 0.18/0.215/0.25 MG-35 MCG tablet Take 1 tablet by mouth daily. 1 Package 11   [DISCONTINUED] ranitidine (ZANTAC) 75 MG tablet Take 1 tablet (75 mg total) by mouth daily. 60 tablet 1   [DISCONTINUED] Simethicone 180 MG CAPS Take 1 capsule (180 mg total) by mouth 3 (three) times daily as needed (gas/flatulance/bloating). 30 capsule 0   No current facility-administered medications on file prior to visit.    Allergies  Allergen Reactions   Doxycycline Hives    Social History:  reports that she has never smoked. She has never used smokeless tobacco. She reports that she does not drink alcohol and does not use drugs.  Family History  Problem Relation Age of Onset   Ovarian cancer Mother    Breast cancer Paternal Aunt     The following portions of the patient's history were reviewed and updated as appropriate: allergies, current medications, past family history, past medical history, past social history, past surgical history and problem list.  Review of Systems Pertinent items noted in HPI and remainder of comprehensive ROS otherwise negative.  Physical Exam:  BP 116/62   Pulse 65   Ht '5\' 2"'$  (1.575 m)   Wt 170 lb 8 oz (77.3 kg)   LMP 04/28/2022 (Exact Date) Comment: lasted 5 days;  irregular periods since stopping OCPs--heavier bleeding, more pain in lower abdomen (more on RLQ)  BMI 31.18 kg/m  Physical Exam Vitals and nursing note reviewed. Exam conducted with a chaperone present.  Constitutional:      Appearance: Normal appearance.  Cardiovascular:     Rate and Rhythm: Normal rate.  Pulmonary:     Effort: Pulmonary effort is normal.     Breath sounds: Normal breath sounds.  Abdominal:       Comments: NO allodynia but reported "pinching" sensation in dotted area  Genitourinary:    General: Normal vulva.     Exam position: Lithotomy position.     Vagina: Normal.  Cervix: Normal.     Comments: Normal appearing vulva Normal vulvar sensation bilaterally Nontender superficial pelvic floor muscles Nontender ischial tuberosities bilaterally  Allodynia at introitus: No  Anal wink present Posterior vaginal wall tender Right levator ani 5/10 Right ischiococcygeous 8/10 Right obturator internus 10/10 Left levator ani 4/10 Left ischioccocygeous 3/10 Left obturator internus 7/10 Anterior vaginal wall tender Uterine tenderness nontender   Musculoskeletal:       Back:     Comments: ~3cm collection/cyst/lump in R lower lumbar area, lateral to SI, tender to palpation without overlying erythema   Neurological:     General: No focal deficit present.     Mental Status: She is alert and oriented to person, place, and time.  Psychiatric:        Mood and Affect: Mood normal.        Behavior: Behavior normal.        Thought Content: Thought content normal.        Judgment: Judgment normal.    Endometrial Biopsy Procedure  Patient identified, informed consent performed,  indication reviewed, consent signed.  Reviewed risk of perforation, pain, bleeding, insufficient sample, etc were reviewd. Time out was performed.  Urine pregnancy test negative.  Speculum placed in the vagina.  Cervix visualized.  Cleaned with Betadine x 2.  Anterior cervix infiltrated with  0.5cc of 1% lidocaine. Grasped anteriorly with a single tooth tenaculum.  Paracervical block was administered and the endocervical canal instilled using 1.5cc total.  Endometrial pipelle was used to draw up 1cc of 1% lidocaine, introduced into the cervical os and instilled into the endometrial cavity.  The pipelle was passed twice without difficulty and sample obtained. Tenaculum was removed, good hemostasis noted.  Patient tolerated procedure well.  Patient was given post-procedure instructions.   Assessment and Plan:   1. Pelvic pain in female Abdominopelvic myalgia on exam as well. Unclear what is cause of lump/bump/collection in right lower lateral back - recommend imaging (superficial ultrasound vs  MRI)  - US PELVIC COMPLETE WITH TRANSVAGINAL; Future - Ambulatory referral to Physical Therapy - norethindrone (MICRONOR) 0.35 MG tablet; Take 1 tablet (0.35 mg total) by mouth daily.  Dispense: 30 tablet; Refill: 11  2. Abnormal uterine bleeding (AUB) Discussed management options for abnormal uterine bleeding including NSAIDs (Naproxen), tranexamic acid (Lysteda), oral progesterone, Depo Provera, Levonogestrel IUD, endometrial ablation or hysterectomy as definitive surgical management.  Discussed risks and benefits of each method.   Patient offered POP to help manage bleeding and hopefully avoid mood lability.   Bleeding precautions reviewed.   - Surgical pathology - CBC - Follicle stimulating hormone - Hemoglobin A1c - Prolactin - TSH Rfx on Abnormal to Free T4 - norethindrone (MICRONOR) 0.35 MG tablet; Take 1 tablet (0.35 mg total) by mouth daily.  Dispense: 30 tablet; Refill: 11  3. Negative pregnancy test   Routine preventative health maintenance measures emphasized. Please refer to After Visit Summary for other counseling recommendations.      Darliss Cheney, MD Obstetrician & Gynecologist, [facility] [employer] [facility],  [place]

## 2022-05-08 NOTE — Progress Notes (Unsigned)
Scheduled [name] on November 27th @ 0900.  Pt notified.   Alexandria Casey  05/08/22

## 2022-05-08 NOTE — Progress Notes (Incomplete)
Had been on pills for 10 years to prevent pregnancy. Prior to birth control she had pain and irregular menses.  Way more bleeding, pain and general "inflammation" tampon and pad at the same time to  3-4 x a tampon in a day. Regular tampons used, full and leaking on the pad. No bleeding in beween menses. 25-28 , 18 days of bleeding July , typically bleeding for 4-8 days, 1-2 times having a cycle in a month.   Pain colicky pain in the back, feeling inflammation in the upper stomach  Can't have anything on her belly due to the pain  Pain I  isn RLQ and not sure wehre it starts (front to back o rback to front) Always has the pain, but when on the period it geets worse  And she will feel that the bloating is so bad she can' twell Constipation occasionally and no blood. Soemtimes having burning with  - intermittent , and only after the pills were stopped Stopped takign the pills  The health departmetn stopped for a while When takin them, seh felt very stress and mood were worse with hte pills , didn't  Pain was occasional   Had the mirena x1 month bu thad a lot of inflmaation and bloating and bleeding so had it rmeoved    -NSAIDS, MICORNOR PELVIC PT SUPERIFICAL [name] VS mri OF BACK CYST

## 2022-05-09 LAB — CBC
Hematocrit: 38.3 % (ref 34.0–46.6)
Hemoglobin: 12.6 g/dL (ref 11.1–15.9)
MCH: 27 pg (ref 26.6–33.0)
MCHC: 32.9 g/dL (ref 31.5–35.7)
MCV: 82 fL (ref 79–97)
Platelets: 340 10*3/uL (ref 150–450)
RBC: 4.67 x10E6/uL (ref 3.77–5.28)
RDW: 12.9 % (ref 11.7–15.4)
WBC: 6 10*3/uL (ref 3.4–10.8)

## 2022-05-09 LAB — HEMOGLOBIN A1C
Est. average glucose Bld gHb Est-mCnc: 117 mg/dL
Hgb A1c MFr Bld: 5.7 % — ABNORMAL HIGH (ref 4.8–5.6)

## 2022-05-09 LAB — TSH RFX ON ABNORMAL TO FREE T4: TSH: 3.18 u[IU]/mL (ref 0.450–4.500)

## 2022-05-09 LAB — FOLLICLE STIMULATING HORMONE: FSH: 11.3 m[IU]/mL

## 2022-05-09 LAB — PROLACTIN: Prolactin: 30.6 ng/mL — ABNORMAL HIGH (ref 4.8–23.3)

## 2022-05-11 LAB — SURGICAL PATHOLOGY

## 2022-05-14 ENCOUNTER — Other Ambulatory Visit: Payer: No Typology Code available for payment source

## 2022-05-16 ENCOUNTER — Other Ambulatory Visit: Payer: Self-pay | Admitting: Obstetrics and Gynecology

## 2022-05-16 DIAGNOSIS — R222 Localized swelling, mass and lump, trunk: Secondary | ICD-10-CM

## 2022-05-28 ENCOUNTER — Encounter: Payer: Self-pay | Admitting: *Deleted

## 2022-06-21 NOTE — Therapy (Deleted)
OUTPATIENT PHYSICAL THERAPY FEMALE PELVIC EVALUATION   Patient Name: Alexandria Casey MRN: [id number] DOB:10-17-1975, 47 y.o., female Today's Date: 06/21/2022  END OF SESSION:   Past Medical History:  Diagnosis Date   ABNORMAL MATERNAL GLUCOSE TOLERANCE ANTEPARTUM 10/26/2009   Qualifier: Diagnosis of  By: Martinique, Bonnie     IUD (intrauterine device) in place 08/26/10   Low blood pressure    Past Surgical History:  Procedure Laterality Date   BREAST LUMPECTOMY WITH RADIOACTIVE SEED LOCALIZATION Right 09/07/2020   Procedure: RIGHT BREAST LUMPECTOMY WITH RADIOACTIVE SEED LOCALIZATION;  Surgeon: Erroll Luna, MD;  Location: [facility];  Service: General;  Laterality: Right;   Patient Active Problem List   Diagnosis Date Noted   Presbyopia of both eyes 12/29/2021   Stationary peripheral pterygium of right eye 12/29/2021   History of pterygium excision 09/17/2021   Peripheral pterygium, progressive, left 09/11/2021   Pterygium of left eye 02/25/2015   Allergic conjunctivitis 11/11/2014   Unspecified constipation 02/23/2014   Multiple nevi 01/29/2013   Rash of face 30/86/5784   Umbilical hernia 69/62/9528   Pelvic pain in female 04/17/2012   Fatigue 01/23/2011   GERD 07/06/2010    PCP: No  REFERRING PROVIDER: Darliss Cheney, MD   REFERRING DIAG: R10.2 (ICD-10-CM) - Pelvic pain in female   THERAPY DIAG:  No diagnosis found.  Rationale for Evaluation and Treatment: Rehabilitation  ONSET DATE: ***  SUBJECTIVE:                                                                                                                                                                                           SUBJECTIVE STATEMENT: *** Fluid intake: {Yes/No:304960894}   PAIN:  Are you having pain? {yes/no:20286} NPRS scale: ***/10 Pain location: {pelvic pain location:27098}  Pain type: {type:313116} Pain description: {PAIN DESCRIPTION:21022940}    Aggravating factors: *** Relieving factors: ***  PRECAUTIONS: {Therapy precautions:24002}  WEIGHT BEARING RESTRICTIONS: {Yes ***/No:24003}  FALLS:  Has patient fallen in last 6 months? {fallsyesno:27318}  LIVING ENVIRONMENT: Lives with: {OPRC lives with:25569::"lives with their family"} Lives in: {Lives in:25570} Stairs: {opstairs:27293} Has following equipment at home: {Assistive devices:23999}  OCCUPATION: ***  PLOF: {PLOF:24004}  PATIENT GOALS: ***  PERTINENT HISTORY:  *** Sexual abuse: {Yes/No:304960894}  BOWEL MOVEMENT: Pain with bowel movement: {yes/no:20286} Type of bowel movement:{PT BM type:27100} Fully empty rectum: {Yes/No:304960894} Leakage: {Yes/No:304960894} Pads: {Yes/No:304960894} Fiber supplement: {Yes/No:304960894}  URINATION: Pain with urination: {yes/no:20286} Fully empty bladder: {Yes/No:304960894} Stream: {PT urination:27102} Urgency: {Yes/No:304960894} Frequency: *** Leakage: {PT leakage:27103} Pads: {Yes/No:304960894}  INTERCOURSE: Pain with intercourse: {pain with intercourse PA:27099} Ability to have vaginal penetration:  {Yes/No:304960894} Climax: *** Marinoff  Scale: ***/3  PREGNANCY: Vaginal deliveries *** Tearing {Yes***/No:304960894} C-section deliveries *** Currently pregnant {Yes***/No:304960894}  PROLAPSE: {PT prolapse:27101}   OBJECTIVE:   DIAGNOSTIC FINDINGS:  ***  PATIENT SURVEYS:  {rehab surveys:24030}  PFIQ-7 ***  COGNITION: Overall cognitive status: {cognition:24006}     SENSATION: Light touch: {intact/deficits:24005} Proprioception: {intact/deficits:24005}  MUSCLE LENGTH: Hamstrings: Right *** deg; Left *** deg Thomas test: Right *** deg; Left *** deg  LUMBAR SPECIAL TESTS:  {lumbar special test:25242}  FUNCTIONAL TESTS:  {Functional tests:24029}  GAIT: Distance walked: *** Assistive device utilized: {Assistive devices:23999} Level of assistance: {Levels of assistance:24026} Comments:  ***  POSTURE: {posture:25561}  PELVIC ALIGNMENT:  LUMBARAROM/PROM:  A/PROM A/PROM  eval  Flexion   Extension   Right lateral flexion   Left lateral flexion   Right rotation   Left rotation    (Blank rows = not tested)  LOWER EXTREMITY ROM:  {AROM/PROM:27142} ROM Right eval Left eval  Hip flexion    Hip extension    Hip abduction    Hip adduction    Hip internal rotation    Hip external rotation    Knee flexion    Knee extension    Ankle dorsiflexion    Ankle plantarflexion    Ankle inversion    Ankle eversion     (Blank rows = not tested)  LOWER EXTREMITY MMT:  MMT Right eval Left eval  Hip flexion    Hip extension    Hip abduction    Hip adduction    Hip internal rotation    Hip external rotation    Knee flexion    Knee extension    Ankle dorsiflexion    Ankle plantarflexion    Ankle inversion    Ankle eversion     PALPATION:   General  ***                External Perineal Exam ***                             Internal Pelvic Floor ***  Patient confirms identification and approves PT to assess internal pelvic floor and treatment {yes/no:20286}  PELVIC MMT:   MMT eval  Vaginal   Internal Anal Sphincter   External Anal Sphincter   Puborectalis   Diastasis Recti   (Blank rows = not tested)        TONE: ***  PROLAPSE: ***  TODAY'S TREATMENT:                                                                                                                              DATE: ***  EVAL ***   PATIENT EDUCATION:  Education details: *** Person educated: {Person educated:25204} Education method: {Education Method:25205} Education comprehension: {Education Comprehension:25206}  HOME EXERCISE PROGRAM: ***  ASSESSMENT:  CLINICAL IMPRESSION: Patient is a *** y.o. *** who was seen today for physical therapy evaluation and treatment for ***.  OBJECTIVE IMPAIRMENTS: {opptimpairments:25111}.   ACTIVITY LIMITATIONS:  {activitylimitations:27494}  PARTICIPATION LIMITATIONS: {participationrestrictions:25113}  PERSONAL FACTORS: {Personal factors:25162} are also affecting patient's functional outcome.   REHAB POTENTIAL: {rehabpotential:25112}  CLINICAL DECISION MAKING: {clinical decision making:25114}  EVALUATION COMPLEXITY: {Evaluation complexity:25115}   GOALS: Goals reviewed with patient? {yes/no:20286}  SHORT TERM GOALS: Target date: ***  *** Baseline: Goal status: {GOALSTATUS:25110}  2.  *** Baseline:  Goal status: {GOALSTATUS:25110}  3.  *** Baseline:  Goal status: {GOALSTATUS:25110}  4.  *** Baseline:  Goal status: {GOALSTATUS:25110}  5.  *** Baseline:  Goal status: {GOALSTATUS:25110}  6.  *** Baseline:  Goal status: {GOALSTATUS:25110}  LONG TERM GOALS: Target date: ***  *** Baseline:  Goal status: {GOALSTATUS:25110}  2.  *** Baseline:  Goal status: {GOALSTATUS:25110}  3.  *** Baseline:  Goal status: {GOALSTATUS:25110}  4.  *** Baseline:  Goal status: {GOALSTATUS:25110}  5.  *** Baseline:  Goal status: {GOALSTATUS:25110}  6.  *** Baseline:  Goal status: {GOALSTATUS:25110}  PLAN:  PT FREQUENCY: {rehab frequency:25116}  PT DURATION: {rehab duration:25117}  PLANNED INTERVENTIONS: {rehab planned interventions:25118::"Therapeutic exercises","Therapeutic activity","Neuromuscular re-education","Balance training","Gait training","Patient/Family education","Self Care","Joint mobilization"}  PLAN FOR NEXT SESSION: ***   Camillo Flaming Lillianne Eick, PT 06/21/2022, 3:11 PM

## 2022-06-22 ENCOUNTER — Ambulatory Visit: Payer: Self-pay | Attending: Obstetrics and Gynecology | Admitting: Physical Therapy

## 2022-07-09 IMAGING — MG MM BREAST BX W/ LOC DEV 1ST LESION IMAGE BX SPEC STEREO GUIDE*R*
7 series · 8 of 15 positions shown · non-contrast
Comparison: Previous exams.
COMPARISON: Previous exams.

Addendum:
CLINICAL DATA: Patient with indeterminate right breast
calcifications.

EXAM:
RIGHT BREAST STEREOTACTIC CORE NEEDLE BIOPSY

[R (1 of 4)]
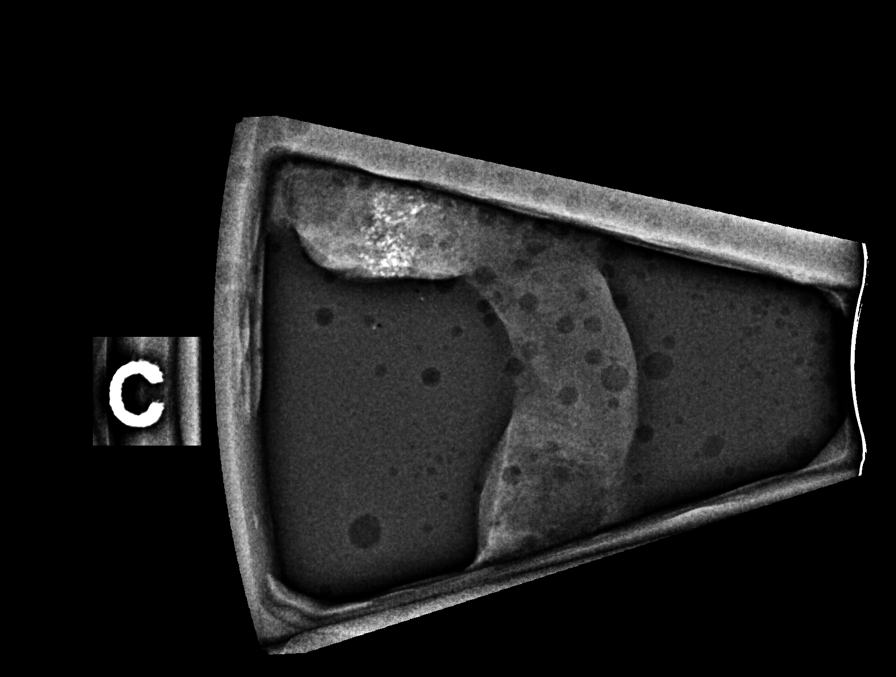

[R (2 of 4)]
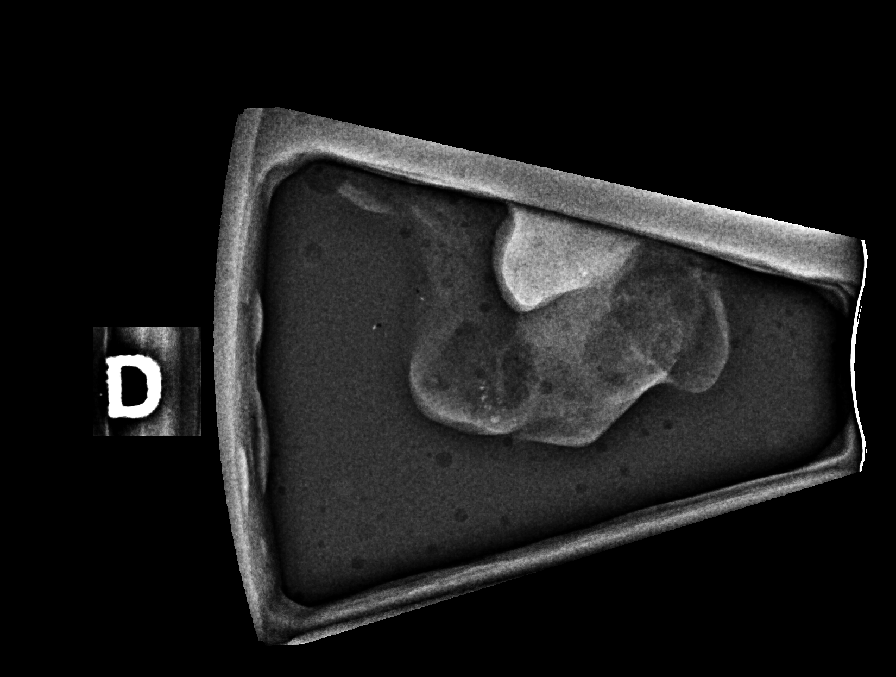

[R (3 of 4)]
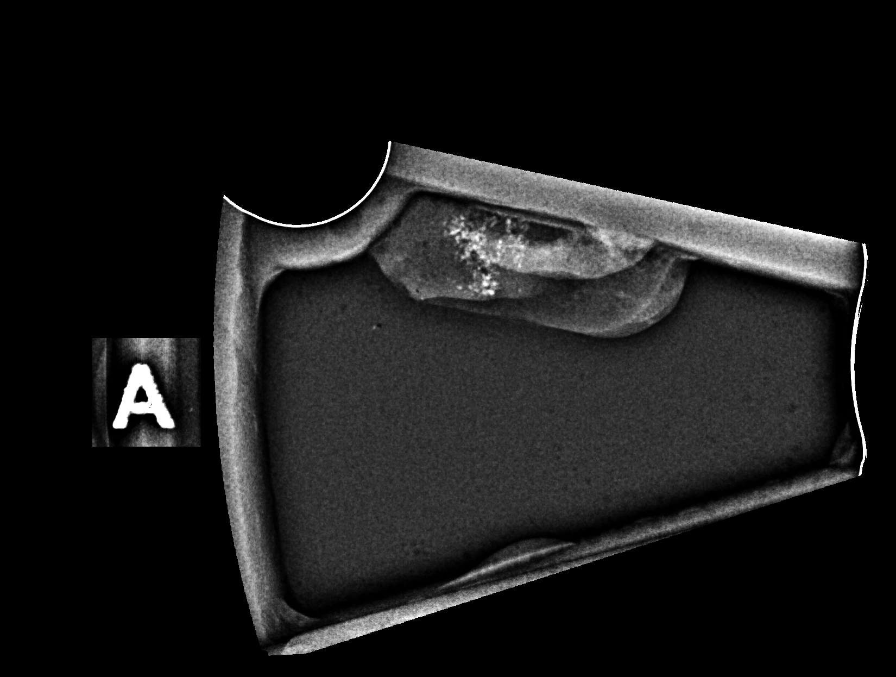

[R (4 of 4)]
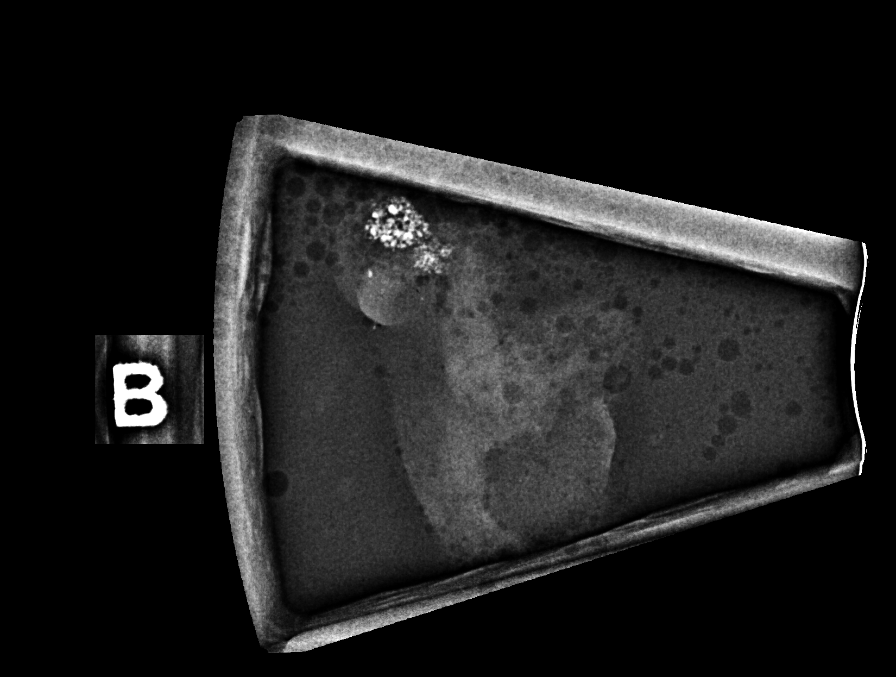

[R CC]
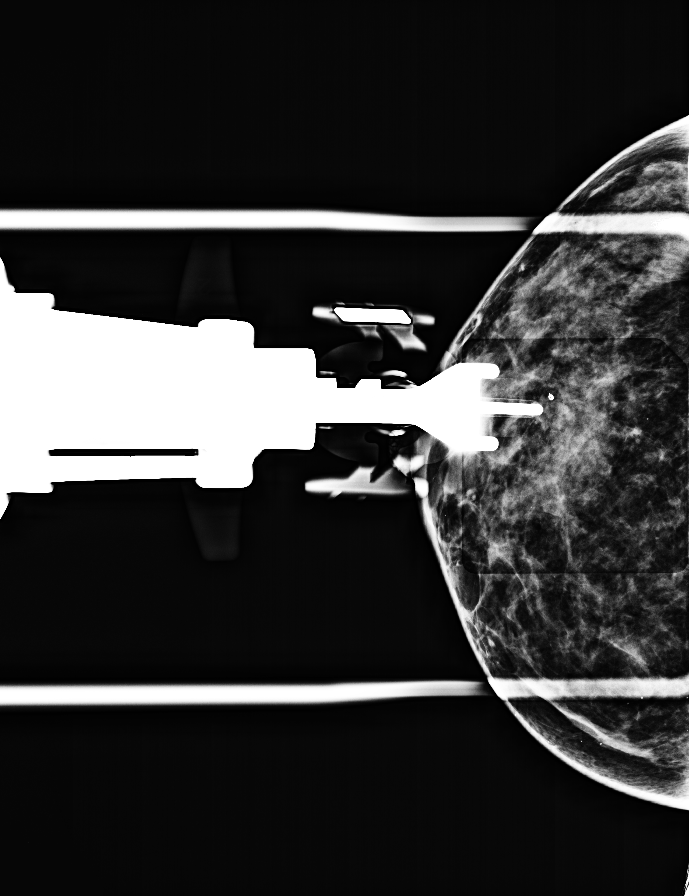

[R CC tomo · 2 of 58 frames shown (1 of 2)]
[frame 19/58]
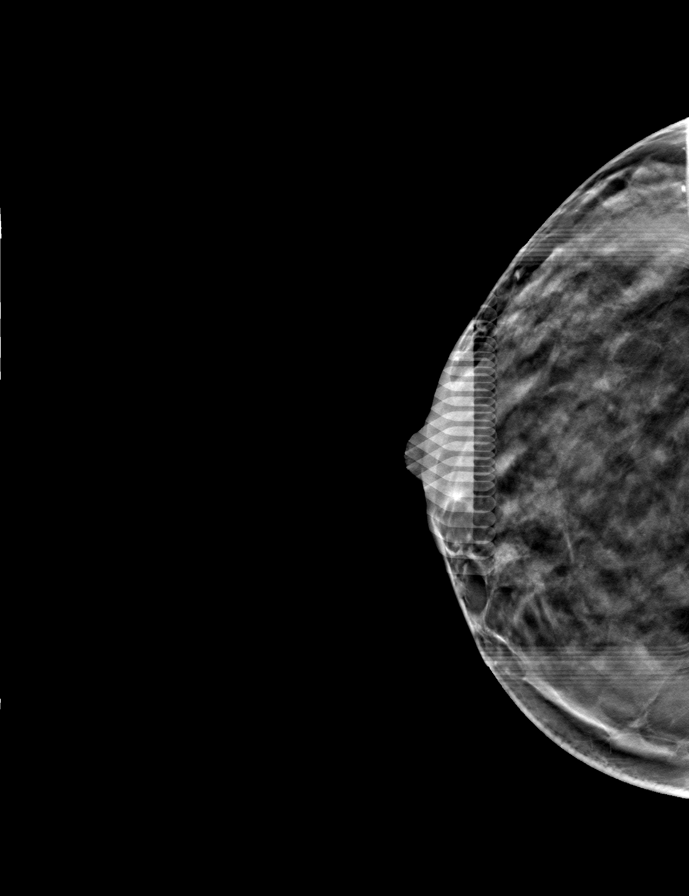
[frame 29/58]
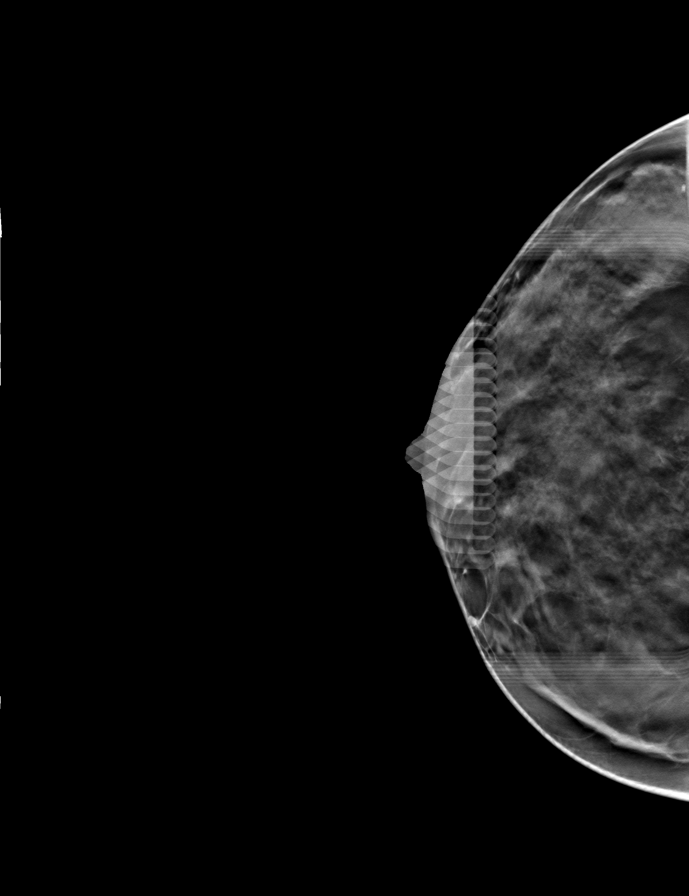

[R CC tomo (2 of 2) · tomo slice 29/58.0]
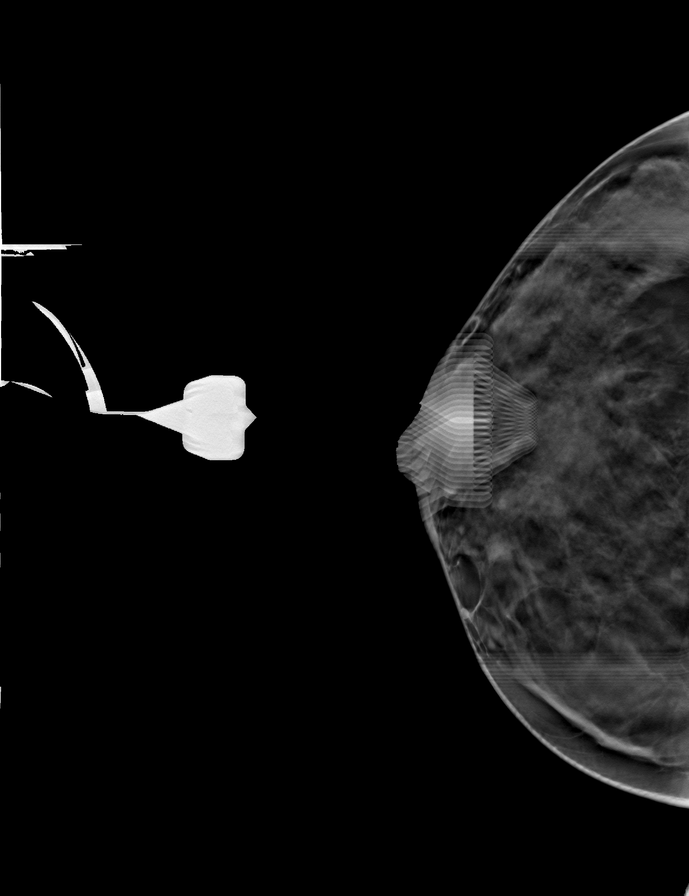

[8 of 15 positions shown; findings below may reference images not displayed]



Using sterile technique and 1% Lidocaine as local anesthetic, under
stereotactic guidance, a 9 gauge vacuum assisted device was used to
perform core needle biopsy of calcifications within the upper-outer
right breast anterior depth using a cranial approach. Specimen
radiograph was performed showing calcifications. Specimens with
calcifications are identified for pathology.

Lesion quadrant: Upper outer quadrant

At the conclusion of the procedure, coil shaped tissue marker clip
was deployed into the biopsy cavity. Follow-up 2-view mammogram was
performed and dictated separately.
IMPRESSION: Stereotactic-guided biopsy of right breast calcifications. No
apparent complications.

ADDENDUM:
Pathology revealed LOBULAR NEOPLASIA (ATYPICAL LOBULAR HYPERPLASIA),
PSEUDOANGIOMATOUS STROMAL HYPERPLASIA (PASH), ADENOSIS AND
FIBROCYSTIC CHANGE WITH CALCIFICATIONS AND CYST RUPTURE of the Right
breast, upper outer. This was found to be concordant by Dr. Arif Ali
Tome, with excision recommended.

Pathology results were discussed with the patient by telephone by
patient reported doing well after the biopsy with tenderness at the
site. Post biopsy instructions and care were reviewed and questions
were answered. The patient was encouraged to call The [REDACTED]

Surgical consultation has been arranged with Dr. Avel Germosen at
[REDACTED] on August 10, 2020.

Pathology results reported by Fuddy Craa-Z, RN on 07/15/2020.



Using sterile technique and 1% Lidocaine as local anesthetic, under
stereotactic guidance, a 9 gauge vacuum assisted device was used to
perform core needle biopsy of calcifications within the upper-outer
right breast anterior depth using a cranial approach. Specimen
radiograph was performed showing calcifications. Specimens with
calcifications are identified for pathology.

Lesion quadrant: Upper outer quadrant

At the conclusion of the procedure, coil shaped tissue marker clip
was deployed into the biopsy cavity. Follow-up 2-view mammogram was
performed and dictated separately.
IMPRESSION: Stereotactic-guided biopsy of right breast calcifications. No
apparent complications.

## 2022-09-24 ENCOUNTER — Ambulatory Visit
Admission: RE | Admit: 2022-09-24 | Discharge: 2022-09-24 | Disposition: A | Discharge status: Home / Self Care | Payer: No Typology Code available for payment source | Source: Ambulatory Visit | Attending: Obstetrics and Gynecology | Admitting: Obstetrics and Gynecology

## 2022-09-24 DIAGNOSIS — N6489 Other specified disorders of breast: Secondary | ICD-10-CM

## 2022-09-24 DIAGNOSIS — R599 Enlarged lymph nodes, unspecified: Secondary | ICD-10-CM

## 2022-11-05 ENCOUNTER — Ambulatory Visit (HOSPITAL_COMMUNITY)
Admission: RE | Admit: 2022-11-05 | Discharge: 2022-11-05 | Disposition: A | Discharge status: Home / Self Care | Payer: No Typology Code available for payment source | Source: Ambulatory Visit | Attending: Obstetrics and Gynecology | Admitting: Obstetrics and Gynecology

## 2022-11-05 DIAGNOSIS — R102 Pelvic and perineal pain: Secondary | ICD-10-CM | POA: Insufficient documentation

## 2022-11-07 ENCOUNTER — Telehealth: Payer: Self-pay

## 2022-11-07 DIAGNOSIS — M545 Low back pain, unspecified: Secondary | ICD-10-CM

## 2022-11-07 NOTE — Telephone Encounter (Signed)
Call placed to pt with interpreter Eda. Spoke with pt. Pt given results per Dr Briscoe Deutscher. Pt verbalized understanding.  Judeth Cornfield, RNC

## 2022-11-07 NOTE — Telephone Encounter (Signed)
-----   Message from Lorriane Shire, MD sent at 11/06/2022  7:54 AM EDT ----- Notify of normal ultrasound and will see how bleeding is with medication. Recommend follow up with primary care about lower back concern.

## 2022-11-15 ENCOUNTER — Ambulatory Visit (INDEPENDENT_AMBULATORY_CARE_PROVIDER_SITE_OTHER): Payer: Self-pay | Admitting: Obstetrics and Gynecology

## 2022-11-15 ENCOUNTER — Other Ambulatory Visit: Payer: Self-pay

## 2022-11-15 ENCOUNTER — Encounter: Payer: Self-pay | Admitting: Obstetrics and Gynecology

## 2022-11-15 VITALS — BP 110/56 | HR 70 | Wt 168.1 lb

## 2022-11-15 DIAGNOSIS — R102 Pelvic and perineal pain unspecified side: Secondary | ICD-10-CM

## 2022-11-15 DIAGNOSIS — N939 Abnormal uterine and vaginal bleeding, unspecified: Secondary | ICD-10-CM

## 2022-11-15 MED ORDER — DROSPIRENONE-ETHINYL ESTRADIOL 3-0.02 MG PO TABS: 1.0000 | ORAL_TABLET | Freq: Every day | ORAL | 11 refills | Status: AC

## 2022-11-15 MED ORDER — CYCLOBENZAPRINE HCL 10 MG PO TABS: 10.0000 mg | ORAL_TABLET | Freq: Two times a day (BID) | ORAL | 2 refills | Status: DC

## 2022-11-15 NOTE — Progress Notes (Signed)
GYNECOLOGY VISIT  Patient name: Alexandria Casey MRN [id number]  Date of birth: 1976/02/06 Chief Complaint:   AUB follow up   History:  Alexandria Casey is a 47 y.o. [phone] being seen today for AUB followup. Menses are once a month (not consistently)  Havy bleeidn durin the first day Pain is silightly better but still bad Having pain in lower back w/ periods and for 2 weeks after has no energy  Feels very bloated with ovulation and bloating and then when she has the menses will  For the whole   Currently for pain: ibuprofen and tylenol, mostly ibuprofen   No pain with intercourse Was having some pain in RLQ radiating to back and would feel it with intercourse but the pain is lower since being on micronor Decreased desire for intercourse   Past Medical History:  Diagnosis Date   ABNORMAL MATERNAL GLUCOSE TOLERANCE ANTEPARTUM 10/26/2009   Qualifier: Diagnosis of  By: [place], Bonnie     IUD (intrauterine device) in place 08/26/10   Low blood pressure     Past Surgical History:  Procedure Laterality Date   BREAST LUMPECTOMY WITH RADIOACTIVE SEED LOCALIZATION Right 09/07/2020   Procedure: RIGHT BREAST LUMPECTOMY WITH RADIOACTIVE SEED LOCALIZATION;  Surgeon: Harriette Bouillon, MD;  Location: [facility];  Service: General;  Laterality: Right;    The following portions of the patient's history were reviewed and updated as appropriate: allergies, current medications, past family history, past medical history, past social history, past surgical history and problem list.   Health Maintenance:   Last pap 05/2020 NILM, HPV neg   Review of Systems:  Pertinent items are noted in HPI. Comprehensive review of systems was otherwise negative.   Objective:  Physical Exam BP (!) 110/56   Pulse 70   Wt 168 lb 1.6 oz (76.2 kg)   LMP  (Approximate)   BMI 30.75 kg/m    Physical Exam Vitals and nursing note reviewed.  Constitutional:       Appearance: Normal appearance.  HENT:     Head: Normocephalic and atraumatic.  Pulmonary:     Effort: Pulmonary effort is normal.  Skin:    General: Skin is warm and dry.  Neurological:     General: No focal deficit present.     Mental Status: She is alert.  Psychiatric:        Mood and Affect: Mood normal.        Behavior: Behavior normal.        Thought Content: Thought content normal.        Judgment: Judgment normal.      Labs and Imaging US PELVIC COMPLETE WITH TRANSVAGINAL  Result Date: 11/05/2022 CLINICAL DATA:  Dysmenorrhea.  Pelvic pain EXAM: TRANSABDOMINAL AND TRANSVAGINAL ULTRASOUND OF PELVIS TECHNIQUE: Both transabdominal and transvaginal ultrasound examinations of the pelvis were performed. Transabdominal technique was performed for global imaging of the pelvis including uterus, ovaries, adnexal regions, and pelvic cul-de-sac. It was necessary to proceed with endovaginal exam following the transabdominal exam to visualize the adnexal structures. COMPARISON:  None Available. FINDINGS: Uterus Measurements: 8.7 x 4.5 x 6.0 cm = volume: 124.3 mL. No fibroids or other mass visualized. Endometrium Thickness: 5 mm.  No focal abnormality visualized. Right ovary Not visualized Left ovary Measurements: 3.2 x 1.8 x 3.0 cm = volume: 8.6 mL. Normal appearance/no adnexal mass. Other findings No abnormal free fluid. IMPRESSION: 1. No acute process. Electronically Signed   By: Annia Belt M.D.   On:  11/05/2022 21:51       Assessment & Plan:   1. Abnormal uterine bleeding (AUB) Some improvement with POP - will trial OCP, Yaz specifically given prior hx of emotional disruption with other OCP. If not well tolerated, can trial lower estrogen pill. Briefly discussed alternate options including, patch, ring, endometrial ablation and hysterectomy. Would like to continue with medical management for now.  - drospirenone-ethinyl estradiol (YAZ) 3-0.02 MG tablet; Take 1 tablet by mouth daily.   Dispense: 28 tablet; Refill: 11 - CBC  2. Pelvic pain in female Prior PFPT referral placed but did not follow up. Trial of muscle relaxer prn for pain.  - cyclobenzaprine (FLEXERIL) 10 MG tablet; Take 1 tablet (10 mg total) by mouth in the morning and at bedtime.  Dispense: 30 tablet; Refill: 2   Follow up in 2-3 months   Lorriane Shire, MD [facility], [facility]

## 2022-11-16 LAB — CBC
Hematocrit: 43.3 % (ref 34.0–46.6)
Hemoglobin: 13.7 g/dL (ref 11.1–15.9)
MCH: 26.7 pg (ref 26.6–33.0)
MCHC: 31.6 g/dL (ref 31.5–35.7)
MCV: 84 fL (ref 79–97)
Platelets: 360 10*3/uL (ref 150–450)
RBC: 5.13 x10E6/uL (ref 3.77–5.28)
RDW: 13.1 % (ref 11.7–15.4)
WBC: 5.8 10*3/uL (ref 3.4–10.8)

## 2022-11-19 ENCOUNTER — Telehealth: Payer: Self-pay

## 2022-11-19 NOTE — Telephone Encounter (Addendum)
-----   Message from Lorriane Shire, MD sent at 11/16/2022  8:45 AM EDT ----- Notify of normal blood count - no anemia. Will follow up response to new medication in 2-3 months  Called pt with interpreter Claudia. Results and provider recommendation given. Pt would like to go ahead and schedule follow up visit; prefers AM appointment.

## 2023-02-05 ENCOUNTER — Ambulatory Visit (HOSPITAL_BASED_OUTPATIENT_CLINIC_OR_DEPARTMENT_OTHER)
Admission: RE | Admit: 2023-02-05 | Discharge: 2023-02-05 | Disposition: A | Discharge status: Home / Self Care | Payer: Self-pay | Source: Ambulatory Visit | Attending: Internal Medicine | Admitting: Internal Medicine

## 2023-02-05 ENCOUNTER — Encounter: Payer: Self-pay | Admitting: Internal Medicine

## 2023-02-05 ENCOUNTER — Ambulatory Visit (INDEPENDENT_AMBULATORY_CARE_PROVIDER_SITE_OTHER): Payer: No Typology Code available for payment source | Admitting: Internal Medicine

## 2023-02-05 VITALS — BP 100/60 | HR 66 | Temp 98.1°F | Ht 62.0 in | Wt 168.4 lb

## 2023-02-05 DIAGNOSIS — M545 Low back pain, unspecified: Secondary | ICD-10-CM

## 2023-02-05 DIAGNOSIS — G8929 Other chronic pain: Secondary | ICD-10-CM

## 2023-02-05 DIAGNOSIS — R102 Pelvic and perineal pain: Secondary | ICD-10-CM

## 2023-02-05 MED ORDER — CYCLOBENZAPRINE HCL 10 MG PO TABS: 10.0000 mg | ORAL_TABLET | Freq: Three times a day (TID) | ORAL | 2 refills | Status: DC | PRN

## 2023-02-05 NOTE — Progress Notes (Signed)
[facility] [address] [place] [address] Dept: [phone] Dept Fax: [phone]  New Patient Office Visit  Subjective:   Alexandria Casey 11-21-1975 02/05/2023  Chief Complaint  Patient presents with   Establish Care   Tailbone Pain    Pain runs down legs, started a while ago pain comes and go. Taking Tylenol ibpro    HPI: Alexandria Casey presents today to establish care at [facility] [employer]. Introduced to Publishing rights manager role and practice setting.  All questions answered.  Concerns: see below   Due to language barrier, a medical interpreter was present during the HPI, ROS, and discussion for the plan of care.  Interpreter: [name].    BACK PAIN:  Alexandria Casey presents with complaint of back pain.   Onset: several years ago  Location: R. Lower back and RLQ/pelvic area Recent Injury: no Character: Reports "inflammation" to RLQ, usually occurs with menstrual cycle  Radiation of Pain: R. lower quadrant and R. Leg .   Aggravating Factors: movement , bending down, extreme cold temperatures.  Alleviating Factors: muscle relaxant   ASSOCIATED SYMPTOMS:  Dysuria: no Nausea: yes -intermittent  Vomiting: no Hematuria: no Bowel or Bladder Dysfunction: no Numbness: yes - R. Leg , intermittent Weakness: no  Patient was seen by OBGYN on 05/08/2022 for pelvic pain and right lower quadrant pain that worsens with her menses.  Patient underwent ultrasound that that did not show any acute findings for cause of pain.  Patient followed up with OB/GYN on 11/15/2022, continue to have some pain in the right lower quadrant radiating to her back and would experience this pain during intercourse.  Patient was referred to pelvic floor physical therapy but she did not follow-up.  She was also given a trial of Flexeril, in which she states today that it did help with the  pain.   The following portions of the patient's history were reviewed and updated as appropriate: past medical history, past surgical history, family history, social history, allergies, medications, and problem list.   Patient Active Problem List   Diagnosis Date Noted   Presbyopia of both eyes 12/29/2021   Stationary peripheral pterygium of right eye 12/29/2021   History of pterygium excision 09/17/2021   Peripheral pterygium, progressive, left 09/11/2021   Pterygium of left eye 02/25/2015   Allergic conjunctivitis 11/11/2014   Unspecified constipation 02/23/2014   Multiple nevi 01/29/2013   Rash of face 09/08/2012   Umbilical hernia 09/08/2012   Pelvic pain in female 04/17/2012   Fatigue 01/23/2011   GERD 07/06/2010   Past Medical History:  Diagnosis Date   ABNORMAL MATERNAL GLUCOSE TOLERANCE ANTEPARTUM 10/26/2009   Qualifier: Diagnosis of  By: [place], Bonnie     IUD (intrauterine device) in place 08/26/10   Low blood pressure    Past Surgical History:  Procedure Laterality Date   BREAST LUMPECTOMY WITH RADIOACTIVE SEED LOCALIZATION Right 09/07/2020   Procedure: RIGHT BREAST LUMPECTOMY WITH RADIOACTIVE SEED LOCALIZATION;  Surgeon: Harriette Bouillon, MD;  Location: [facility];  Service: General;  Laterality: Right;   Family History  Problem Relation Age of Onset   Ovarian cancer Mother    Breast cancer Paternal Aunt    Outpatient Medications Prior to Visit  Medication Sig Dispense Refill   drospirenone-ethinyl estradiol (YAZ) 3-0.02 MG tablet Take 1 tablet by mouth daily. 28 tablet 11   fluticasone (FLONASE) 50 MCG/ACT nasal spray Place 1-2 sprays into both nostrils daily. 16  g 0   ibuprofen (ADVIL) 800 MG tablet Take 1 tablet (800 mg total) by mouth 3 (three) times daily. 21 tablet 0   Cetirizine HCl 10 MG CAPS Take 1 capsule (10 mg total) by mouth daily for 10 days. 10 capsule 0   cyclobenzaprine (FLEXERIL) 10 MG tablet Take 1 tablet (10 mg total) by mouth in  the morning and at bedtime. 30 tablet 2   ibuprofen (ADVIL) 800 MG tablet Take 1 tablet (800 mg total) by mouth every 8 (eight) hours as needed. (Patient not taking: Reported on 11/15/2022) 30 tablet 0   oxyCODONE (OXY IR/ROXICODONE) 5 MG immediate release tablet Take 1 tablet (5 mg total) by mouth every 6 (six) hours as needed for severe pain. (Patient not taking: Reported on 03/22/2022) 15 tablet 0   polyethylene glycol (MIRALAX) 17 g packet Take 17 g by mouth daily. (Patient not taking: Reported on 03/22/2022) 14 each 0   predniSONE (DELTASONE) 50 MG tablet One tablet a day (Patient not taking: Reported on 03/22/2022) 5 tablet 0   No facility-administered medications prior to visit.   Allergies  Allergen Reactions   Doxycycline Hives    ROS: A complete ROS was performed with pertinent positives/negatives noted in the HPI. The remainder of the ROS are negative.   Objective:   Today's Vitals   02/05/23 0857  BP: 100/60  Pulse: 66  Temp: 98.1 F (36.7 C)  TempSrc: Temporal  SpO2: 98%  Weight: 168 lb 6.4 oz (76.4 kg)  Height: 5\' 2"  (1.575 m)    GENERAL: Well-appearing, in NAD. Well nourished.  SKIN: Pink, warm and dry. No rash.  3 cm approximated cyst/lump to right lumbar region, lateral to SI.  Tender to palpation, area is freely movable, no erythema. NECK: Trachea midline. Full ROM w/o pain or tenderness. No lymphadenopathy.  RESPIRATORY: Chest wall symmetrical. Respirations even and non-labored. Breath sounds clear to auscultation bilaterally.  CARDIAC: S1, S2 present, regular rate and rhythm. Peripheral pulses 2+ bilaterally.  GI: Abdomen soft, tenderness to palpation to right groin/pelvic region.  Negative McBurney point tenderness. Normoactive bowel sounds. No rebound tenderness. No hepatomegaly or splenomegaly. No CVA tenderness.  MSK: Muscle tone and strength appropriate for age. Joints w/o tenderness, redness, or swelling.  Negative straight leg raise bilaterally.  Tenderness  to palpation to right lumbar region.   EXTREMITIES: Without clubbing, cyanosis, or edema.  NEUROLOGIC: No motor or sensory deficits. Steady, even gait.  PSYCH/MENTAL STATUS: Alert, oriented x 3. Cooperative, appropriate mood and affect.   Health Maintenance Due  Topic Date Due   Hepatitis C Screening  Never done   DTaP/Tdap/Td (2 - Tdap) 02/22/2015   Colonoscopy  Never done   INFLUENZA VACCINE  01/17/2023    No results found for any visits on 02/05/23.  Assessment & Plan:  1. Pelvic pain - cyclobenzaprine (FLEXERIL) 10 MG tablet; Take 1 tablet (10 mg total) by mouth 3 (three) times daily as needed for muscle spasms.  Dispense: 30 tablet; Refill: 2 - Ambulatory referral to Physical Therapy -Patient will trial pelvic floor therapy and round of muscle relaxants.   2. Chronic right-sided low back pain, unspecified whether sciatica present - cyclobenzaprine (FLEXERIL) 10 MG tablet; Take 1 tablet (10 mg total) by mouth 3 (three) times daily as needed for muscle spasms.  Dispense: 30 tablet; Refill: 2 - DG Lumbar Spine 2-3 Views; Future  -We will obtain x-ray of lumbar spine, refer to PT, and trial course of muscle relaxants.  If abnormal  x-ray results and/or no improvement with PT, will then refer to Ortho   Return in about 3 months (around 05/08/2023) for Fasting Annual Physical Exam.   Of note, portions of this note may have been created with voice recognition software Physicist, medical). While this note has been edited for accuracy, occasional wrong-word or 'sound-a-like' substitutions may have occurred due to the inherent limitations of voice recognition software.  Salvatore Decent, FNP

## 2023-02-22 ENCOUNTER — Other Ambulatory Visit: Payer: Self-pay

## 2023-02-22 ENCOUNTER — Ambulatory Visit: Payer: Self-pay | Attending: Internal Medicine | Admitting: Physical Therapy

## 2023-02-22 DIAGNOSIS — R293 Abnormal posture: Secondary | ICD-10-CM

## 2023-02-22 DIAGNOSIS — R102 Pelvic and perineal pain: Secondary | ICD-10-CM | POA: Insufficient documentation

## 2023-02-22 DIAGNOSIS — M62838 Other muscle spasm: Secondary | ICD-10-CM

## 2023-02-22 DIAGNOSIS — M6281 Muscle weakness (generalized): Secondary | ICD-10-CM

## 2023-02-22 DIAGNOSIS — R279 Unspecified lack of coordination: Secondary | ICD-10-CM

## 2023-02-22 NOTE — Therapy (Signed)
OUTPATIENT PHYSICAL THERAPY FEMALE PELVIC EVALUATION   Patient Name: Navee Percy MRN: [id number] DOB:Sep 11, 1975, 47 y.o., female Today's Date: 02/22/2023  END OF SESSION:  PT End of Session - 02/22/23 0935     Visit Number 1    Date for PT Re-Evaluation 06/24/23    Authorization Type [employer]    PT Start Time [id number]    PT Stop Time 1010    PT Time Calculation (min) 39 min    Activity Tolerance Patient tolerated treatment well;No increased pain    Behavior During Therapy [facility] for tasks assessed/performed             Past Medical History:  Diagnosis Date   ABNORMAL MATERNAL GLUCOSE TOLERANCE ANTEPARTUM 10/26/2009   Qualifier: Diagnosis of  By: [place], Bonnie     IUD (intrauterine device) in place 08/26/10   Low blood pressure    Past Surgical History:  Procedure Laterality Date   BREAST LUMPECTOMY WITH RADIOACTIVE SEED LOCALIZATION Right 09/07/2020   Procedure: RIGHT BREAST LUMPECTOMY WITH RADIOACTIVE SEED LOCALIZATION;  Surgeon: Harriette Bouillon, MD;  Location: [facility];  Service: General;  Laterality: Right;   Patient Active Problem List   Diagnosis Date Noted   Presbyopia of both eyes 12/29/2021   Stationary peripheral pterygium of right eye 12/29/2021   History of pterygium excision 09/17/2021   Peripheral pterygium, progressive, left 09/11/2021   Pterygium of left eye 02/25/2015   Allergic conjunctivitis 11/11/2014   Unspecified constipation 02/23/2014   Multiple nevi 01/29/2013   Rash of face 09/08/2012   Umbilical hernia 09/08/2012   Pelvic pain in female 04/17/2012   Fatigue 01/23/2011   GERD 07/06/2010    PCP: Salvatore Decent, FNP   REFERRING PROVIDER: Salvatore Decent, FNP   REFERRING DIAG: R10.2 (ICD-10-CM) - Pelvic pain  THERAPY DIAG:  Other muscle spasm  Muscle weakness (generalized)  Abnormal posture  Unspecified lack of coordination  Rationale for Evaluation and Treatment: Rehabilitation  ONSET DATE: 1 year and  worsening since then   SUBJECTIVE:                                                                                                                                                                                           SUBJECTIVE STATEMENT: Gyn and primary reports prolapse causing pt's Rt pelvic and low back pain.    PAIN:  Are you having pain? Yes NPRS scale: 7/10 Pain location:  Rt abdomen, Rt low back  Pain type: achy sometimes but can get sharp Pain description: intermittent   Aggravating factors: period,working out with weights, sudden movements, after intercourse  Relieving factors: muscle relaxer  PRECAUTIONS: None  RED FLAGS: None   WEIGHT BEARING RESTRICTIONS: No  FALLS:  Has patient fallen in last 6 months? No  LIVING ENVIRONMENT: Lives with: lives with their family Lives in: House/apartment   OCCUPATION: not currently  PLOF: Independent  PATIENT GOALS: to have less pain  PERTINENT HISTORY:  Hernia, 3x vaginal births with tearing Sexual abuse: No  BOWEL MOVEMENT: Pain with bowel movement: No Type of bowel movement:Type (Bristol Stool Scale) 1-2, Frequency 1x daily usually, and Strain Yes Fully empty rectum: No Leakage: No Pads: No Fiber supplement: No  URINATION: Pain with urination: No Fully empty bladder: No does need to strain here as well.  Stream: Strong Urgency: No Frequency: around every 2 hours; not usually but sometimes 1x nightly  Leakage:  none Pads: No  INTERCOURSE: Pain with intercourse:  none Ability to have vaginal penetration:  Yes:   Climax: not painful Marinoff Scale: 0/3  PREGNANCY: Vaginal deliveries 3 Tearing Yes:   C-section deliveries 0 Currently pregnant No  PROLAPSE: Heaviness in vagina with periods    OBJECTIVE:   DIAGNOSTIC FINDINGS:    COGNITION: Overall cognitive status: Within functional limits for tasks assessed     SENSATION: Light touch: Appears intact Proprioception: Appears  intact  MUSCLE LENGTH: Bil hamstrings and adductors limited by 25%  LUMBAR SPECIAL TESTS:  Stork standing: 6s Rt 4s Lt then noted pelvic drops, SI Compression/distraction test: Negative, and FABER test: Negative  FUNCTIONAL TESTS:  Functional  squat     POSTURE: rounded shoulders, forward head, and posterior pelvic tilt  PELVIC ALIGNMENT: WFL  LUMBARAROM/PROM:  A/PROM A/PROM  eval  Flexion Limited by 25%  Extension WFL  Right lateral flexion Limited by 50%  Left lateral flexion Limited by 25%  Right rotation Limited by 50%  Left rotation Limited by 25%   (Blank rows = not tested)  LOWER EXTREMITY ROM:  Bil WFL  LOWER EXTREMITY MMT:  Rt hip grossly 4/5; Lt  hip grossly 4/5; knees 5/5  PALPATION:   General  TTP at Rt lower abdomen quadrant, and proximal adductor; bil piriformis, bil lumbar paraspinals but Rt worse than left                 External Perineal Exam no TTP                             Internal Pelvic Floor TTP throughout deep Rt muscle layer  Patient confirms identification and approves PT to assess internal pelvic floor and treatment Yes No emotional/communication barriers or cognitive limitation. Patient is motivated to learn. Patient understands and agrees with treatment goals and plan. PT explains patient will be examined in standing, sitting, and lying down to see how their muscles and joints work. When they are ready, they will be asked to remove their underwear so PT can examine their perineum. The patient is also given the option of providing their own chaperone as one is not provided in our facility. The patient also has the right and is explained the right to defer or refuse any part of the evaluation or treatment including the internal exam. With the patient's consent, PT will use one gloved finger to gently assess the muscles of the pelvic floor, seeing how well it contracts and relaxes and if there is muscle symmetry. After, the patient will get  dressed and PT and patient will discuss exam findings and plan of care. PT and patient discuss plan  of care, schedule, attendance policy and HEP activities.   PELVIC MMT:   MMT eval  Vaginal 3/5 with cues; initially pushed downward. 7s, 5 reps  Internal Anal Sphincter   External Anal Sphincter   Puborectalis   Diastasis Recti   (Blank rows = not tested)        TONE: WFL  PROLAPSE: Possible grade 2 anterior wall laxity with cough in hooklying   TODAY'S TREATMENT:                                                                                                                              DATE:   02/22/23 EVAL Examination completed, findings reviewed, pt educated on POC, HEP, and prolapse relief positions. Pt motivated to participate in PT and agreeable to attempt recommendations.     PATIENT EDUCATION:  Education details: [id number] Person educated: Patient Education method: Explanation, Demonstration, Tactile cues, Verbal cues, and Handouts Education comprehension: verbalized understanding and returned demonstration  HOME EXERCISE PROGRAM: [id number]  ASSESSMENT:  CLINICAL IMPRESSION: Patient is a 47 y.o. female  who was seen today for physical therapy evaluation and treatment for pelvic pain, anterior vaginal wall laxity, constipation. Pt reports symptoms have been worsening over the past year, now worse with period and increased activity. Pt found to have decreased flexibility in spine and hips, decreased core and hip strength, tight lumbar and gluteals, TTP at Rt lower abdomen quadrant, and proximal adductor; bil piriformis, bil lumbar paraspinals but Rt worse than left. Patient consented to internal pelvic floor assessment vaginally this date and found to have decreased strength, endurance, and coordination and possible grade 2 anterior wall laxity with cough. Pt would benefit from additional PT to further address deficits.    OBJECTIVE IMPAIRMENTS: decreased activity tolerance,  decreased coordination, decreased endurance, decreased mobility, increased fascial restrictions, increased muscle spasms, impaired flexibility, improper body mechanics, postural dysfunction, and pain.   ACTIVITY LIMITATIONS: lifting, bending, standing, squatting, and locomotion level  PARTICIPATION LIMITATIONS: interpersonal relationship, community activity, and yard work  PERSONAL FACTORS: Time since onset of injury/illness/exacerbation and 1 comorbidity: x3 vaginal births   are also affecting patient's functional outcome.   REHAB POTENTIAL: Good  CLINICAL DECISION MAKING: Stable/uncomplicated  EVALUATION COMPLEXITY: Low   GOALS: Goals reviewed with patient? Yes  SHORT TERM GOALS: Target date: 03/22/23  Pt to be I with HEP.  Baseline: Goal status: INITIAL  2.  Pt to demonstrate improved coordination of pelvic floor and breathing mechanics with body weight squat with appropriate synergistic patterns to decrease pain and leakage at least 50% of the time.    Baseline:  Goal status: INITIAL  3.  Pt will report her BMs are complete due to improved bowel habits and evacuation techniques at least 50% of the time.  Baseline:  Goal status: INITIAL   LONG TERM GOALS: Target date: 06/24/23  Pt to be I with advanced HEP.  Baseline:  Goal status: INITIAL  2.  Pt to demonstrate  at least 5/5 bil hip strength for improved pelvic stability and functional squats without leakage.  Baseline:  Goal status: INITIAL  3.  Pt to demonstrate improved coordination of pelvic floor and breathing mechanics with 15# squat with appropriate synergistic patterns to decrease pain and leakage at least 75% of the time.    Baseline:  Goal status: INITIAL  4.  Pt will report her BMs and bladder voids are complete due to improved habits and evacuation techniques at least 75% of the time.  Baseline:  Goal status: INITIAL  5.  Pt to demonstrate at least 4/5 pelvic floor strength for improved pelvic stability  and decreased strain at pelvic floor/ decrease leakage.  Baseline:  Goal status: INITIAL  6.  Pt to report improved time between bladder voids to at least 3 hours for improved QOL with decreased urinary frequency.   Baseline:  Goal status: INITIAL  PLAN:  PT FREQUENCY: 1x/week  PT DURATION:  8 sessions  PLANNED INTERVENTIONS: Therapeutic exercises, Therapeutic activity, Neuromuscular re-education, Patient/Family education, Self Care, Joint mobilization, Dry Needling, Spinal mobilization, Cryotherapy, Moist heat, scar mobilization, Taping, Vasopneumatic device, Biofeedback, Manual therapy, and Re-evaluation  PLAN FOR NEXT SESSION: internal as needed and pt consents, breathing mechanics, voiding mechanics, abdominal massage, lifting mechanics, coordination of pelvic floor and breathing mechanics with and without activity, core and hip strengthening, urge drill    Otelia Sergeant, PT, DPT 02/21/2409:18 AM  [facility] [address] [place], [place] [address] Phone # [phone] Fax [phone]

## 2023-03-05 ENCOUNTER — Other Ambulatory Visit: Payer: Self-pay

## 2023-03-05 DIAGNOSIS — R92322 Mammographic fibroglandular density, left breast: Secondary | ICD-10-CM

## 2023-04-18 ENCOUNTER — Ambulatory Visit
Admission: RE | Admit: 2023-04-18 | Discharge: 2023-04-18 | Disposition: A | Discharge status: Home / Self Care | Payer: No Typology Code available for payment source | Source: Ambulatory Visit | Attending: Obstetrics and Gynecology | Admitting: Obstetrics and Gynecology

## 2023-04-18 ENCOUNTER — Other Ambulatory Visit: Payer: Self-pay | Admitting: Obstetrics and Gynecology

## 2023-04-18 ENCOUNTER — Ambulatory Visit: Payer: Self-pay | Admitting: Hematology and Oncology

## 2023-04-18 VITALS — BP 115/78 | Wt 173.5 lb

## 2023-04-18 DIAGNOSIS — R92322 Mammographic fibroglandular density, left breast: Secondary | ICD-10-CM

## 2023-04-18 DIAGNOSIS — Z1211 Encounter for screening for malignant neoplasm of colon: Secondary | ICD-10-CM

## 2023-04-18 NOTE — Patient Instructions (Signed)
Taught Alexandria Casey about self breast awareness and gave educational materials to take home. Patient did not need a Pap smear today due to last Pap smear was in 05/24/2020 per patient. Let her know [facility] will cover Pap smears every 5 years unless has a history of abnormal Pap smears. Referred [facility] to [facility] for diagnostic mamm[facility]. Appointment scheduled for 04/18/23. Patient aware of appointment and will be there. Let patient know will follow up with her within the next couple weeks with results. [name] verbalized understanding.  Pascal Lux, NP 8:59 AM

## 2023-04-18 NOTE — Progress Notes (Addendum)
[name] is a 47 y.o. female who presents to [facility] clinic today with no complaints. 6 month follow up stable probably benign dense tissue.   Pap Smear: Pap not smear completed today. Last Pap smear was 05/24/2020 and was normal. Per patient has no history of an abnormal Pap smear. Last Pap smear result is available in Epic.   Physical exam: Breasts Breasts symmetrical. No skin abnormalities bilateral breasts. No nipple retraction bilateral breasts. No nipple discharge bilateral breasts. No lymphadenopathy. No lumps palpated bilateral breasts.    MM DIAG BREAST TOMO UNI LEFT  Result Date: 09/24/2022 CLINICAL DATA:  Follow-up for probably benign asymmetry within the lower LEFT breast which was initially identified on diagnostic mammogram of 03/22/2022. Patient also presents today for follow-up of a probably benign reactive lymph node in the RIGHT axilla. Patient presented in October of 2023 with RIGHT breast pain which has resolved. EXAM: DIGITAL DIAGNOSTIC UNILATERAL LEFT MAMMOGRAM WITH TOMOSYNTHESIS; [name] AXILLARY RIGHT TECHNIQUE: Left digital diagnostic mammography and breast tomosynthesis was performed.; Targeted ultrasound examination of the right axilla was performed. COMPARISON:  Previous exams including diagnostic mammogram and ultrasound dated 03/22/2022. ACR Breast Density Category c: The breasts are heterogeneously dense, which may obscure small masses. FINDINGS: The previously questioned asymmetry within the lower LEFT breast is stable to slightly less conspicuous today, suggesting superimposition of normal dense fibroglandular tissues. There are no new dominant masses, suspicious calcifications or secondary signs of malignancy elsewhere within the LEFT breast. Targeted ultrasound is performed, again showing the mildly prominent lymph node in the RIGHT axilla, but now with a cortex thickness measurement of 3 mm which is within normal limits. IMPRESSION: 1. Stable probably benign  superimposition of normal dense fibroglandular tissues within the lower LEFT breast. Recommend additional follow-up diagnostic mammogram in 6 months to ensure continued stability. 2. Benign-appearing lymph node within the RIGHT axilla. No additional follow-up diagnostic imaging is needed for this benign finding. RECOMMENDATION: Bilateral diagnostic mammogram in 6 months. I have discussed the findings and recommendations with the patient. If applicable, a reminder letter will be sent to the patient regarding the next appointment. BI-RADS CATEGORY  3: Probably benign. Electronically Signed   By: Alexandria Casey M.D.   On: 09/24/2022 12:02  MM DIAG BREAST TOMO BILATERAL  Result Date: 03/22/2022 CLINICAL DATA:  Patient presents for bilateral diagnostic examination due to focal pain over the upper outer quadrant of the right breast. Excisional biopsy of the right breast 2022 for [facility] and [facility]. EXAM: DIGITAL DIAGNOSTIC BILATERAL MAMMOGRAM WITH TOMOSYNTHESIS; ULTRASOUND LEFT BREAST LIMITED; ULTRASOUND RIGHT BREAST LIMITED TECHNIQUE: Bilateral digital diagnostic mammography and breast tomosynthesis was performed.; Targeted ultrasound examination of the left breast was performed.; Targeted ultrasound examination of the right breast was performed COMPARISON:  Previous exam(s). ACR Breast Density Category c: The breast tissue is heterogeneously dense, which may obscure small masses. FINDINGS: Examination demonstrates no focal abnormality over the upper outer right breast to account for patient's focal pain. Right breast is otherwise unchanged. There is asymmetry over the lower left breast which persists on the spot image. Remainder of the left breast is unchanged. Targeted ultrasound is performed, showing no focal abnormality over the upper outer quadrant of the right breast to account for patient's focal pain. Incidental borderline abnormal right axillary lymph node is visualized with cortical thickening of 4 mm. Ultrasound  over the inner lower quadrant of the left breast demonstrates no focal abnormality. IMPRESSION: 1. No focal abnormality over the upper outer right breast to account  for patient's focal pain. 2. Borderline abnormal right axillary lymph node likely benign reactive lymph node. 3.  Probable benign asymmetry over the lower left breast. RECOMMENDATION: 1. Recommend continued management of patient's focal pain over the upper outer right breast on a clinical basis. 2. Recommend six-month follow-up diagnostic right axillary ultrasound to document stability of the probable benign lymph node. 3. Recommend six-month follow-up diagnostic left breast mammogram to document stability of the probable benign asymmetry over the lower breast. I have discussed the findings and recommendations with the patient via interpreter. If applicable, a reminder letter will be sent to the patient regarding the next appointment. BI-RADS CATEGORY  3: Probably benign. Electronically Signed   By: Alexandria Casey M.D.   On: 03/22/2022 12:31  MM Breast Surgical Specimen  Result Date: 09/07/2020 CLINICAL DATA:  Evaluate surgical specimen following excision of RIGHT breast ALH. EXAM: SPECIMEN RADIOGRAPH OF THE RIGHT BREAST COMPARISON:  Previous exam(s). FINDINGS: Status post excision of the RIGHT breast. The radioactive seed is present and completely intact. The COIL clip is not visualized within the specimen and the clinical team notified in the OR. Additional tissue was obtained along SUPERIOR and LATERAL aspects. IMPRESSION: Specimen radiograph of the RIGHT breast. The COIL clip is not visualized with information related to the clinical team. Electronically Signed   By: Alexandria Casey M.D.   On: 09/07/2020 10:37   MM RT RADIOACTIVE SEED LOC MAMMO GUIDE  Result Date: 09/06/2020 CLINICAL DATA:  47 year old with biopsy-proven lobular neoplasia (atypical lobular hyperplasia) involving the UPPER OUTER QUADRANT of the RIGHT breast. Radioactive seed  localization is performed in anticipation of excisional biopsy. EXAM: MAMMOGRAPHIC GUIDED RADIOACTIVE SEED LOCALIZATION OF THE RIGHT BREAST COMPARISON:  Previous exam(s). FINDINGS: Patient presents for radioactive seed localization prior to RIGHT breast excisional biopsy. I met with the patient and we discussed the procedure of seed localization including benefits and alternatives. We discussed the high likelihood of a successful procedure. We discussed the risks of the procedure including infection, bleeding, tissue injury and further surgery. We discussed the low dose of radioactivity involved in the procedure. Informed, written consent was given. The usual time-out protocol was performed immediately prior to the procedure. Using mammographic guidance, sterile technique with chlorhexidine as skin antisepsis, 1% lidocaine as local anesthetic, an I-125 radioactive seed was used to localize the coil shaped tissue marking clip associated with the residual calcifications in the UPPER OUTER QUADRANT of the RIGHT breast using a lateral approach. The follow-up mammogram images confirm the seed is appropriately positioned immediately adjacent to the clip and residual calcifications. The images are marked for Dr. Luisa Hart. Follow-up survey of the patient confirms the presence of the radioactive seed. Order number of I-125 seed: [id number] Total activity: 0.242 mCi Reference Date: 08/18/2020 The patient tolerated the procedure well and was released from the [facility]. She was given instructions regarding seed removal. 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 No apparent complications. Electronically Signed   By: Hulan Saas M.D.   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 This was found to be concordant by Dr. Annia Belt, with excision recommended. Pathology results were  discussed with the patient by telephone by Durene Cal, Bilingual Patient Services Representative. The patient reported doing well after the biopsy with tenderness at the site. Post biopsy instructions and care were reviewed and questions were answered. The patient was encouraged to call [facility] for any additional concerns. Surgical consultation has been arranged with Dr. Harriette Bouillon at [facility] on August 10, 2020. Pathology results reported by Rene Kocher, RN on 07/15/2020. Electronically Signed   By: Annia Belt M.D.   On: 07/15/2020 14:42   Result Date: 07/15/2020 CLINICAL DATA:  Patient with indeterminate right breast calcifications. EXAM: RIGHT BREAST STEREOTACTIC CORE NEEDLE BIOPSY COMPARISON:  Previous exams. FINDINGS: The patient and I discussed the procedure of stereotactic-guided biopsy including benefits and alternatives. We discussed the high likelihood of a successful procedure. We discussed the risks of the procedure including infection, bleeding, tissue injury, clip migration, and inadequate sampling. Informed written consent was given. The usual time out protocol was performed immediately prior to the procedure. Using sterile technique and 1% Lidocaine as local anesthetic, under stereotactic guidance, a 9 gauge vacuum assisted device was used to perform core needle biopsy of calcifications within the upper-outer right breast anterior depth using a cranial approach. Specimen radiograph was performed showing calcifications. Specimens with calcifications are identified for pathology. Lesion quadrant: Upper outer quadrant At the  conclusion of the procedure, coil shaped tissue marker clip was deployed into the biopsy cavity. Follow-up 2-view mammogram was performed and dictated separately. IMPRESSION: Stereotactic-guided biopsy of right breast calcifications. No apparent complications. Electronically Signed: By: Annia Belt M.D. On: 07/13/2020 11:39   MM CLIP PLACEMENT RIGHT  Result Date: 07/13/2020 CLINICAL DATA:  Patient status post stereotactic guided core needle biopsy right breast calcifications. EXAM: DIAGNOSTIC RIGHT MAMMOGRAM POST STEREOTACTIC BIOPSY COMPARISON:  Previous exam(s). FINDINGS: Mammographic images were obtained following stereotactic guided biopsy of right breast calcifications. The biopsy marking clip is in expected position at the site of biopsy. IMPRESSION: Appropriate positioning of the coil shaped biopsy marking clip at the site of biopsy in the upper outer right breast. Final Assessment: Post Procedure Mammograms for Marker Placement Electronically Signed   By: Annia Belt M.D.   On: 07/13/2020 11:41   MS DIGITAL DIAG UNI RIGHT  Result Date: 06/30/2020 CLINICAL DATA:  Screening recall for right breast calcifications. EXAM: DIGITAL DIAGNOSTIC RIGHT MAMMOGRAM WITH CAD COMPARISON:  Previous exam(s). ACR Breast Density Category c: The breast tissue is heterogeneously dense, which may obscure small masses. FINDINGS: In the upper-outer quadrant of the right breast, middle to anterior depth, there is a 1.1 cm group of amorphous calcifications. Mammographic images were processed with CAD. IMPRESSION: Indeterminate 1.1 cm group of calcifications in the upper-outer right breast. RECOMMENDATION: Stereotactic biopsy is recommended, and has been scheduled for 07/13/2020 at 10:30 a.m. I have discussed the findings and recommendations with the patient. If applicable, a reminder letter will be sent to the patient regarding the next appointment. BI-RADS CATEGORY  4: Suspicious. Electronically Signed   By: Frederico Hamman  M.D.   On: 06/30/2020 12:59   MS DIGITAL SCREENING TOMO BILATERAL  Result Date: 06/09/2020 CLINICAL DATA:  Screening. EXAM: DIGITAL SCREENING BILATERAL MAMMOGRAM WITH TOMO AND CAD COMPARISON:  None. ACR Breast Density Category c: The breast tissue is heterogeneously dense, which may obscure small masses. FINDINGS: In the right breast, calcifications warrant further evaluation with magnified views. In the left breast, no findings suspicious for malignancy. Images were processed with CAD. IMPRESSION: Further evaluation  is suggested for calcifications in the right breast. RECOMMENDATION: Diagnostic mammogram of the right breast. (Code:[id number]) The patient will be contacted regarding the findings, and additional imaging will be scheduled. BI-RADS CATEGORY  0: Incomplete. Need additional imaging evaluation and/or prior mammograms for comparison. Electronically Signed   By: Amie Portland M.D.   On: 06/09/2020 12:01       Pelvic/Bimanual Pap is not indicated today    Smoking History: Patient has never smoked and was not referred to quit line.    Patient Navigation: Patient education provided. Access to services provided for patient through BCCCP program. Viviana Simpler interpreter provided. No transportation provided   Colorectal Cancer Screening: Per patient has never had colonoscopy completed No complaints today. FIT test given.    Breast and Cervical Cancer Risk Assessment: Patient does not have family history of breast cancer, known genetic mutations, or radiation treatment to the chest before age 69. Patient does not have history of cervical dysplasia, immunocompromised, or DES exposure in-utero.  Risk Scores as of Encounter on 04/18/2023     Dondra Spry           5-year 0.73%   Lifetime 8.63%   This patient is [place]/Lat[place] but has no documented birth country, so the [place] model used data from [employer] patients to calculate their risk score. Document a birth country in the Demographics activity for  a more accurate score.         Last calculated by Meryl Dare, CMA on 04/18/2023 at  9:00 AM          A: BCCCP exam without pap smear No complaints with benign exam. 6 month follow up for stable probably benign tissue in the left breast.   P: Referred patient to the [facility] for a diagnostic mammogram. Appointment scheduled 04/18/2023.  Pascal Lux, NP 04/18/2023 8:57 AM

## 2023-04-25 ENCOUNTER — Ambulatory Visit: Payer: Self-pay | Attending: Internal Medicine | Admitting: Physical Therapy

## 2023-04-25 ENCOUNTER — Telehealth: Payer: Self-pay | Admitting: Physical Therapy

## 2023-04-25 NOTE — Telephone Encounter (Signed)
PT called pt about this morning's appointment at 0800. Interpreter used to communicate with pt, she has had difficulty with financials and waiting to hear about this. Will call back for appointments.   Otelia Sergeant, PT, DPT 11/07/248:28 AM

## 2023-04-29 ENCOUNTER — Telehealth: Payer: Self-pay

## 2023-04-29 ENCOUNTER — Ambulatory Visit: Payer: Self-pay | Admitting: Physical Therapy

## 2023-04-29 NOTE — Telephone Encounter (Signed)
Via Rito Ehrlich, [facility] Spanish Interpreter, I contacted patient per scheduler regarding bloody left breast/nipple discharge with exercise. Patient stated she has noticed small drops of blood from the left breast a few months prior to 04/18/2023 diagnostic mammogram. Amount was so scant, did not think much of it. However, on 04/26/2023, after exercising patient noticed a large spot, with 3 smaller spots of blood in the left side of her bra. Patient states it is usually on sweat, but now has increase in bloody discharge since having the mammogram. She only notices with exercise or any strenuous activity. Patient complains of pain, difficulty lying on left side to sleep. Patient denies any history of nipple piercing, skin changes, scabbing in areas, breast expression/manipulation. Bloody discharge occurs spontaneously. No cytology testing has been done. Explained to patient that I will discuss with Melissa, FNP-BC, call her back on 04/30/2023. Patient verbalized understanding.

## 2023-05-01 LAB — FECAL OCCULT BLOOD, IMMUNOCHEMICAL: Fecal Occult Bld: NEGATIVE

## 2023-05-06 ENCOUNTER — Encounter: Payer: Self-pay | Admitting: Physical Therapy

## 2023-05-07 ENCOUNTER — Ambulatory Visit: Payer: Self-pay | Admitting: Hematology and Oncology

## 2023-05-07 VITALS — BP 110/78 | Wt 170.8 lb

## 2023-05-07 DIAGNOSIS — N6452 Nipple discharge: Secondary | ICD-10-CM

## 2023-05-07 NOTE — Progress Notes (Signed)
[name] is a 47 y.o. female who presents to [facility] clinic today with complaint of left nipple discharge.    Pap Smear: Pap not smear completed today. Last Pap smear was 05/24/2020 and was normal. Per patient has no history of an abnormal Pap smear. Last Pap smear result is available in Epic.   Physical exam: Breasts Breasts symmetrical. No skin abnormalities bilateral breasts. No nipple retraction bilateral breasts. No nipple discharge bilateral breasts. No lymphadenopathy. No lumps palpated bilateral breasts.     MS 3D DIAG MAMMO BILAT BR (aka MM)  Result Date: 04/18/2023 CLINICAL DATA:  Short-term follow-up for a probably benign left breast asymmetry as well as a probably benign, borderline abnormal, right axillary lymph node. These findings were initially assessed with diagnostic imaging on 03/22/2022, when the patient presented for with focal pain in the right breast. 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 EXAM: DIGITAL DIAGNOSTIC BILATERAL MAMMOGRAM WITH TOMOSYNTHESIS AND CAD; [name] AXILLARY RIGHT TECHNIQUE: Bilateral digital diagnostic mammography and breast tomosynthesis was performed. The images were evaluated with computer-aided detection. ; Targeted ultrasound examination of the right axilla was performed. COMPARISON:  Previous exam(s). ACR Breast Density Category c: The breasts are heterogeneously dense, which may obscure small masses. FINDINGS: The area of asymmetry noted on the left breast MLO view from 03/22/2022 is less apparent. There are no defined masses or new areas of asymmetry. Postsurgical scarring is noted in the upper right breast from the prior benign excision, stable. There are no areas of nonsurgical architectural distortion. There are no suspicious calcifications. Targeted right axilla ultrasound is performed, showing a normal lymph node, cortical thickness now 2 mm. There are now no abnormal or borderline  abnormal lymph nodes. IMPRESSION: 1. Probably benign left breast asymmetry, initially noted in October 2023, less apparent on the current left breast MLO view. 2. Normalized right axillary lymph node. RECOMMENDATION: 1. Diagnostic bilateral mammography with possible ultrasound in 1 year to provide 2 years of stability for the probably benign left breast asymmetry. I have discussed the findings and recommendations with the patient. If applicable, a reminder letter will be sent to the patient regarding the next appointment. BI-RADS CATEGORY  3: Probably benign. Electronically Signed   By: Amie Portland M.D.   On: 04/18/2023 11:30   MM DIAG BREAST TOMO UNI LEFT  Result Date: 09/24/2022 CLINICAL DATA:  Follow-up for probably benign asymmetry within the lower LEFT breast which was initially identified on diagnostic mammogram of 03/22/2022. Patient also presents today for follow-up of a probably benign reactive lymph node in the RIGHT axilla. Patient presented in October of 2023 with RIGHT breast pain which has resolved. EXAM: DIGITAL DIAGNOSTIC UNILATERAL LEFT MAMMOGRAM WITH TOMOSYNTHESIS; [name] AXILLARY RIGHT TECHNIQUE: Left digital diagnostic mammography and breast tomosynthesis was performed.; Targeted ultrasound examination of the right axilla was performed. COMPARISON:  Previous exams including diagnostic mammogram and ultrasound dated 03/22/2022. ACR Breast Density Category c: The breasts are heterogeneously dense, which may obscure small masses. FINDINGS: The previously questioned asymmetry within the lower LEFT breast is stable to slightly less conspicuous today, suggesting superimposition of normal dense fibroglandular tissues. There are no new dominant masses, suspicious calcifications or secondary signs of malignancy elsewhere within the LEFT breast. Targeted ultrasound is performed, again showing the mildly prominent lymph node in the RIGHT axilla, but now with a cortex thickness measurement of 3 mm which is  within normal limits. IMPRESSION: 1. Stable probably benign superimposition of  normal dense fibroglandular tissues within the lower LEFT breast. Recommend additional follow-up diagnostic mammogram in 6 months to ensure continued stability. 2. Benign-appearing lymph node within the RIGHT axilla. No additional follow-up diagnostic imaging is needed for this benign finding. RECOMMENDATION: Bilateral diagnostic mammogram in 6 months. I have discussed the findings and recommendations with the patient. If applicable, a reminder letter will be sent to the patient regarding the next appointment. BI-RADS CATEGORY  3: Probably benign. Electronically Signed   By: Bary Richard M.D.   On: 09/24/2022 12:02  MM DIAG BREAST TOMO BILATERAL  Result Date: 03/22/2022 CLINICAL DATA:  Patient presents for bilateral diagnostic examination due to focal pain over the upper outer quadrant of the right breast. Excisional biopsy of the right breast 2022 for [facility] and Pash. EXAM: DIGITAL DIAGNOSTIC BILATERAL MAMMOGRAM WITH TOMOSYNTHESIS; ULTRASOUND LEFT BREAST LIMITED; ULTRASOUND RIGHT BREAST LIMITED TECHNIQUE: Bilateral digital diagnostic mammography and breast tomosynthesis was performed.; Targeted ultrasound examination of the left breast was performed.; Targeted ultrasound examination of the right breast was performed COMPARISON:  Previous exam(s). ACR Breast Density Category c: The breast tissue is heterogeneously dense, which may obscure small masses. FINDINGS: Examination demonstrates no focal abnormality over the upper outer right breast to account for patient's focal pain. Right breast is otherwise unchanged. There is asymmetry over the lower left breast which persists on the spot image. Remainder of the left breast is unchanged. Targeted ultrasound is performed, showing no focal abnormality over the upper outer quadrant of the right breast to account for patient's focal pain. Incidental borderline abnormal right axillary lymph node  is visualized with cortical thickening of 4 mm. Ultrasound over the inner lower quadrant of the left breast demonstrates no focal abnormality. IMPRESSION: 1. No focal abnormality over the upper outer right breast to account for patient's focal pain. 2. Borderline abnormal right axillary lymph node likely benign reactive lymph node. 3.  Probable benign asymmetry over the lower left breast. RECOMMENDATION: 1. Recommend continued management of patient's focal pain over the upper outer right breast on a clinical basis. 2. Recommend six-month follow-up diagnostic right axillary ultrasound to document stability of the probable benign lymph node. 3. Recommend six-month follow-up diagnostic left breast mammogram to document stability of the probable benign asymmetry over the lower breast. I have discussed the findings and recommendations with the patient via Casey. If applicable, a reminder letter will be sent to the patient regarding the next appointment. BI-RADS CATEGORY  3: Probably benign. Electronically Signed   By: Elberta Fortis M.D.   On: 03/22/2022 12:31  MM Breast Surgical Specimen  Result Date: 09/07/2020 CLINICAL DATA:  Evaluate surgical specimen following excision of RIGHT breast ALH. EXAM: SPECIMEN RADIOGRAPH OF THE RIGHT BREAST COMPARISON:  Previous exam(s). FINDINGS: Status post excision of the RIGHT breast. The radioactive seed is present and completely intact. The COIL clip is not visualized within the specimen and the clinical team notified in the OR. Additional tissue was obtained along SUPERIOR and LATERAL aspects. IMPRESSION: Specimen radiograph of the RIGHT breast. The COIL clip is not visualized with information related to the clinical team. Electronically Signed   By: Harmon Pier M.D.   On: 09/07/2020 10:37   MM RT RADIOACTIVE SEED LOC MAMMO GUIDE  Result Date: 09/06/2020 CLINICAL DATA:  47 year old with biopsy-proven lobular neoplasia (atypical lobular hyperplasia) involving the UPPER  OUTER QUADRANT of the RIGHT breast. Radioactive seed localization is performed in anticipation of excisional biopsy. EXAM: MAMMOGRAPHIC GUIDED RADIOACTIVE SEED LOCALIZATION OF THE RIGHT BREAST COMPARISON:  Previous exam(s). FINDINGS: Patient presents for radioactive seed localization prior to RIGHT breast excisional biopsy. I met with the patient and we discussed the procedure of seed localization including benefits and alternatives. We discussed the high likelihood of a successful procedure. We discussed the risks of the procedure including infection, bleeding, tissue injury and further surgery. We discussed the low dose of radioactivity involved in the procedure. Informed, written consent was given. The usual time-out protocol was performed immediately prior to the procedure. Using mammographic guidance, sterile technique with chlorhexidine as skin antisepsis, 1% lidocaine as local anesthetic, an I-125 radioactive seed was used to localize the coil shaped tissue marking clip associated with the residual calcifications in the UPPER OUTER QUADRANT of the RIGHT breast using a lateral approach. The follow-up mammogram images confirm the seed is appropriately positioned immediately adjacent to the clip and residual calcifications. The images are marked for Dr. Luisa Hart. Follow-up survey of the patient confirms the presence of the radioactive seed. Order number of I-125 seed: [id number] Total activity: 0.242 mCi Reference Date: 08/18/2020 The patient tolerated the procedure well and was [facility] from the [facility]. She was given instructions regarding seed removal. 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 No apparent complications. Electronically Signed   By: Hulan Saas M.D.   On: 09/06/2020 15:05   MM RT BREAST BX W LOC DEV 1ST LESION IMAGE BX SPEC STEREO GUIDE  Addendum Date: 07/15/2020    ADDENDUM REPORT: 07/15/2020 14:42 ADDENDUM: Pathology revealed LOBULAR NEOPLASIA (ATYPICAL LOBULAR HYPERPLASIA), PSEUDOANGIOMATOUS STROMAL HYPERPLASIA (PASH), ADENOSIS AND FIBROCYSTIC CHANGE WITH CALCIFICATIONS AND CYST RUPTURE of the Right breast, upper outer. This was found to be concordant by Dr. Annia Belt, with excision recommended. Pathology results were discussed with the patient by telephone by Durene Cal, Bilingual Patient Services Representative. The patient reported doing well after the biopsy with tenderness at the site. Post biopsy instructions and care were reviewed and questions were answered. The patient was encouraged to call [facility] for any additional concerns. Surgical consultation has been arranged with Dr. Harriette Bouillon at [facility] on August 10, 2020. Pathology results reported by Rene Kocher, RN on 07/15/2020. Electronically Signed   By: Annia Belt M.D.   On: 07/15/2020 14:42   Result Date: 07/15/2020 CLINICAL DATA:  Patient with indeterminate right breast calcifications. EXAM: RIGHT BREAST STEREOTACTIC CORE NEEDLE BIOPSY COMPARISON:  Previous exams. FINDINGS: The patient and I discussed the procedure of stereotactic-guided biopsy including benefits and alternatives. We discussed the high likelihood of a successful procedure. We discussed the risks of the procedure including infection, bleeding, tissue injury, clip migration, and inadequate sampling. Informed written consent was given. The usual time out protocol was performed immediately prior to the procedure. Using sterile technique and 1% Lidocaine as local anesthetic, under stereotactic guidance, a 9 gauge vacuum assisted device was used to perform core needle biopsy of calcifications within the upper-outer right breast anterior depth using a cranial approach. Specimen radiograph was performed showing calcifications. Specimens with calcifications are identified for pathology.  Lesion quadrant: Upper outer quadrant At the conclusion of the procedure, coil shaped tissue marker clip was deployed into the biopsy cavity. Follow-up 2-view mammogram was performed and dictated separately. IMPRESSION: Stereotactic-guided biopsy of right breast calcifications. No apparent complications. Electronically Signed: By: Annia Belt M.D. On: 07/13/2020 11:39   MM CLIP PLACEMENT RIGHT  Result Date: 07/13/2020 CLINICAL DATA:  Patient status  post stereotactic guided core needle biopsy right breast calcifications. EXAM: DIAGNOSTIC RIGHT MAMMOGRAM POST STEREOTACTIC BIOPSY COMPARISON:  Previous exam(s). FINDINGS: Mammographic images were obtained following stereotactic guided biopsy of right breast calcifications. The biopsy marking clip is in expected position at the site of biopsy. IMPRESSION: Appropriate positioning of the coil shaped biopsy marking clip at the site of biopsy in the upper outer right breast. Final Assessment: Post Procedure Mammograms for Marker Placement Electronically Signed   By: Annia Belt M.D.   On: 07/13/2020 11:41   MS DIGITAL DIAG UNI RIGHT  Result Date: 06/30/2020 CLINICAL DATA:  Screening recall for right breast calcifications. EXAM: DIGITAL DIAGNOSTIC RIGHT MAMMOGRAM WITH CAD COMPARISON:  Previous exam(s). ACR Breast Density Category c: The breast tissue is heterogeneously dense, which may obscure small masses. FINDINGS: In the upper-outer quadrant of the right breast, middle to anterior depth, there is a 1.1 cm group of amorphous calcifications. Mammographic images were processed with CAD. IMPRESSION: Indeterminate 1.1 cm group of calcifications in the upper-outer right breast. RECOMMENDATION: Stereotactic biopsy is recommended, and has been scheduled for 07/13/2020 at 10:30 a.m. I have discussed the findings and recommendations with the patient. If applicable, a reminder letter will be sent to the patient regarding the next appointment. BI-RADS CATEGORY  4: Suspicious.  Electronically Signed   By: Frederico Hamman M.D.   On: 06/30/2020 12:59   MS DIGITAL SCREENING TOMO BILATERAL  Result Date: 06/09/2020 CLINICAL DATA:  Screening. EXAM: DIGITAL SCREENING BILATERAL MAMMOGRAM WITH TOMO AND CAD COMPARISON:  None. ACR Breast Density Category c: The breast tissue is heterogeneously dense, which may obscure small masses. FINDINGS: In the right breast, calcifications warrant further evaluation with magnified views. In the left breast, no findings suspicious for malignancy. Images were processed with CAD. IMPRESSION: Further evaluation is suggested for calcifications in the right breast. RECOMMENDATION: Diagnostic mammogram of the right breast. (Code:[id number]) The patient will be contacted regarding the findings, and additional imaging will be scheduled. BI-RADS CATEGORY  0: Incomplete. Need additional imaging evaluation and/or prior mammograms for comparison. Electronically Signed   By: Amie Portland M.D.   On: 06/09/2020 12:01      Pelvic/Bimanual Pap is not indicated today    Smoking History: Patient has never smoked and was not referred to quit line.    Patient Navigation: Patient education provided. Access to services provided for patient through Alexandria Casey. Alexandria Casey provided. No transportation provided   Colorectal Cancer Screening: Per patient has never had colonoscopy completed No complaints today. FIT negative 04/18/2023   Breast and Cervical Cancer Risk Assessment: Patient does not have family history of breast cancer, known genetic mutations, or radiation treatment to the chest before age 46. Patient does not have history of cervical dysplasia, immunocompromised, or DES exposure in-utero.  Risk Scores as of Encounter on 05/07/2023     Alexandria Casey as of 04/18/2023           5-year 0.73%   Lifetime 8.63%   This patient is [place] but has no documented birth country, so the [place] model used data from [place] patients to calculate  their risk score. Document a birth country in the Demographics activity for a more accurate score.         Last calculated by Alexandria Casey, CMA on 04/18/2023 at  9:00 AM        A: Alexandria exam without pap smear Complaint of bloody left nipple discharge when exercising. Most recently underwent diagnostic mammogram that was benign. This was  occurring prior to the mammogram. Today's exam does nt reveal any discharge. Will have the patient continue to observe.   P: Will continue to monitor.   Alexandria Lux, NP 05/07/2023 10:23 AM

## 2023-05-07 NOTE — Patient Instructions (Signed)
Taught Alexandria Casey about self breast awareness and gave educational materials to take home. Patient did not need a Pap smear today.Told patient about free cervical cancer screenings to receive a Pap smear if would like one next year. Let her know [facility] will cover Pap smears every 3 years unless has a history of abnormal Pap smears. Will continue to monitor breast concerns.   Pascal Lux, NP 11:01 AM

## 2023-05-08 ENCOUNTER — Encounter: Payer: Self-pay | Admitting: Internal Medicine

## 2023-05-08 ENCOUNTER — Ambulatory Visit (INDEPENDENT_AMBULATORY_CARE_PROVIDER_SITE_OTHER): Payer: Self-pay | Admitting: Internal Medicine

## 2023-05-08 VITALS — BP 120/70 | HR 68 | Temp 98.0°F | Ht 62.0 in | Wt 171.4 lb

## 2023-05-08 DIAGNOSIS — Z0000 Encounter for general adult medical examination without abnormal findings: Secondary | ICD-10-CM

## 2023-05-08 DIAGNOSIS — Z23 Encounter for immunization: Secondary | ICD-10-CM

## 2023-05-08 DIAGNOSIS — Z1211 Encounter for screening for malignant neoplasm of colon: Secondary | ICD-10-CM

## 2023-05-08 LAB — LDL CHOLESTEROL, DIRECT: Direct LDL: 151 mg/dL

## 2023-05-08 LAB — CBC WITH DIFFERENTIAL/PLATELET
Basophils Absolute: 0 10*3/uL (ref 0.0–0.1)
Basophils Relative: 1 % (ref 0.0–3.0)
Eosinophils Absolute: 0.1 10*3/uL (ref 0.0–0.7)
Eosinophils Relative: 1.2 % (ref 0.0–5.0)
HCT: 41.1 % (ref 36.0–46.0)
Hemoglobin: 13.9 g/dL (ref 12.0–15.0)
Lymphocytes Relative: 30.3 % (ref 12.0–46.0)
Lymphs Abs: 1.5 10*3/uL (ref 0.7–4.0)
MCHC: 33.9 g/dL (ref 30.0–36.0)
MCV: 84.5 fL (ref 78.0–100.0)
Monocytes Absolute: 0.3 10*3/uL (ref 0.1–1.0)
Monocytes Relative: 6.4 % (ref 3.0–12.0)
Neutro Abs: 3.1 10*3/uL (ref 1.4–7.7)
Neutrophils Relative %: 61.1 % (ref 43.0–77.0)
Platelets: 373 10*3/uL (ref 150.0–400.0)
RBC: 4.86 Mil/uL (ref 3.87–5.11)
RDW: 12.8 % (ref 11.5–15.5)
WBC: 5 10*3/uL (ref 4.0–10.5)

## 2023-05-08 LAB — COMPREHENSIVE METABOLIC PANEL
ALT: 17 U/L (ref 0–35)
AST: 15 U/L (ref 0–37)
Albumin: 4.6 g/dL (ref 3.5–5.2)
Alkaline Phosphatase: 77 U/L (ref 39–117)
BUN: 14 mg/dL (ref 6–23)
CO2: 25 meq/L (ref 19–32)
Calcium: 9.6 mg/dL (ref 8.4–10.5)
Chloride: 101 meq/L (ref 96–112)
Creatinine, Ser: 0.45 mg/dL (ref 0.40–1.20)
GFR: 116.24 mL/min (ref >60.00)
Glucose, Bld: 90 mg/dL (ref 70–99)
Potassium: 4.2 meq/L (ref 3.5–5.1)
Sodium: 136 meq/L (ref 135–145)
Total Bilirubin: 0.3 mg/dL (ref 0.2–1.2)
Total Protein: 7.4 g/dL (ref 6.0–8.3)

## 2023-05-08 LAB — TSH: TSH: 1.96 u[IU]/mL (ref 0.35–5.50)

## 2023-05-08 LAB — LIPID PANEL
Cholesterol: 300 mg/dL — ABNORMAL HIGH (ref 0–200)
HDL: 53.4 mg/dL (ref >39.00)
Total CHOL/HDL Ratio: 6
Triglycerides: 531 mg/dL — ABNORMAL HIGH (ref 0.0–149.0)

## 2023-05-08 NOTE — Patient Instructions (Addendum)
Call obgyn to schedule pap smear

## 2023-05-08 NOTE — Progress Notes (Signed)
Subjective:   Alexandria Casey [birth year]  05/08/2023   CC: Chief Complaint  Patient presents with   Annual Exam   Due to language barrier, a medical interpreter was present during the HPI, ROS, and discussion for the plan of care.  Interpreter: Angie (Spanish)    HPI: Alexandria Casey is a 47 y.o. female who presents for a routine health maintenance exam.  Labs collected at time of visit.    HEALTH SCREENINGS: - Pap smear: up to date - due in dec 2024, has obgyn. She will call to schedule appt.  - Mammogram (40+): Up to date  - Colonoscopy (45+): Ordered today , did ifob 04/18/23 which was negative for blood in stool  - Bone Density (65+): Not applicable  - Lung CA screening with low-dose CT:  Not applicable Adults age 67-80 who are current cigarette smokers or quit within the last 15 years. Must have 20 pack year history.   Depression and Anxiety Screen done today and results listed below:     05/08/2023    8:42 AM 02/05/2023    9:28 AM 11/15/2022    5:27 PM 05/08/2022   10:20 AM 02/25/2015   10:34 AM  Depression screen PHQ 2/9  Decreased Interest 2 1 2 1  0  Down, Depressed, Hopeless 2 1 1  0 0  PHQ - 2 Score 4 2 3 1  0  Altered sleeping 2 0 1 2   Tired, decreased energy 2 1 2 2    Change in appetite 2 0 1 0   Feeling bad or failure about yourself  0 0 0 0   Trouble concentrating 0 1 1 1    Moving slowly or fidgety/restless 0 0 0 0   Suicidal thoughts 0 0 0 0   PHQ-9 Score 10 4 8 6    Difficult doing work/chores Not difficult at all Not difficult at all         05/08/2023    8:42 AM 02/05/2023    9:28 AM 05/08/2022   10:21 AM  GAD 7 : Generalized Anxiety Score  Nervous, Anxious, on Edge 2 1 1   Control/stop worrying 0 0 0  Worry too much - different things 0 0 1  Trouble relaxing 2 0 1  Restless 2 0 0  Easily annoyed or irritable 2 0 1  Afraid - awful might happen 0 0 0  Total GAD 7 Score 8 1 4   Anxiety Difficulty Not difficult at all Not  difficult at all     IMMUNIZATIONS: - Tdap: Tetanus vaccination status reviewed: last tetanus booster within 10 years. - HPV: Not applicable - Influenza: Administered today   Past medical history, surgical history, medications, allergies, family history and social history reviewed with patient today and changes made to appropriate areas of the chart.   Past Medical History:  Diagnosis Date   ABNORMAL MATERNAL GLUCOSE TOLERANCE ANTEPARTUM 10/26/2009   Qualifier: Diagnosis of  By: [place], Bonnie     IUD (intrauterine device) in place 08/26/10   Low blood pressure     Past Surgical History:  Procedure Laterality Date   BREAST LUMPECTOMY WITH RADIOACTIVE SEED LOCALIZATION Right 09/07/2020   Procedure: RIGHT BREAST LUMPECTOMY WITH RADIOACTIVE SEED LOCALIZATION;  Surgeon: Harriette Bouillon, MD;  Location: [facility];  Service: General;  Laterality: Right;    Current Outpatient Medications on File Prior to Visit  Medication Sig   cyclobenzaprine (FLEXERIL) 10 MG tablet Take 1 tablet (10 mg total) by mouth 3 (three) times  daily as needed for muscle spasms. (Patient not taking: Reported on 05/07/2023)   drospirenone-ethinyl estradiol (YAZ) 3-0.02 MG tablet Take 1 tablet by mouth daily. (Patient not taking: Reported on 04/18/2023)   fluticasone (FLONASE) 50 MCG/ACT nasal spray Place 1-2 sprays into both nostrils daily. (Patient not taking: Reported on 04/18/2023)   ibuprofen (ADVIL) 800 MG tablet Take 1 tablet (800 mg total) by mouth 3 (three) times daily. (Patient not taking: Reported on 05/07/2023)   [DISCONTINUED] desogestrel-ethinyl estradiol (APRI,EMOQUETTE,SOLIA) 0.15-30 MG-MCG tablet Take 1 tablet by mouth daily.   [DISCONTINUED] levonorgestrel (MIRENA) 20 MCG/24HR IUD 1 each by Intrauterine route once.     [DISCONTINUED] norgestimate-ethinyl estradiol (ORTHO-CYCLEN,SPRINTEC,PREVIFEM) 0.25-35 MG-MCG tablet Take 1 tablet by mouth daily.   [DISCONTINUED] Norgestimate-Ethinyl  Estradiol Triphasic (TRI-SPRINTEC) 0.18/0.215/0.25 MG-35 MCG tablet Take 1 tablet by mouth daily.   [DISCONTINUED] ranitidine (ZANTAC) 75 MG tablet Take 1 tablet (75 mg total) by mouth daily.   [DISCONTINUED] Simethicone 180 MG CAPS Take 1 capsule (180 mg total) by mouth 3 (three) times daily as needed (gas/flatulance/bloating).   No current facility-administered medications on file prior to visit.    Allergies  Allergen Reactions   Doxycycline Hives     Social History   Socioeconomic History   Marital status: Married    Spouse name: Rigoberto   Number of children: Not on file   Years of education: Not on file   Highest education level: Not on file  Occupational History   Not on file  Tobacco Use   Smoking status: Never   Smokeless tobacco: Never  Vaping Use   Vaping status: Never Used  Substance and Sexual Activity   Alcohol use: No    Comment: occ/social   Drug use: Never   Sexual activity: Yes    Birth control/protection: Pill  Other Topics Concern   Not on file  Social History Narrative   Not on file   Social Determinants of Health   Financial Resource Strain: Not on file  Food Insecurity: No Food Insecurity (05/07/2023)   Hunger Vital Sign    Worried About Running Out of Food in the Last Year: Never true    Ran Out of Food in the Last Year: Never true  Transportation Needs: No Transportation Needs (05/07/2023)   PRAPARE - Administrator, Civil Service (Medical): No    Lack of Transportation (Non-Medical): No  Physical Activity: Not on file  Stress: No Stress Concern Present (08/30/2021)   Received from [facility], [facility] - Occupational Stress Questionnaire    Feeling of Stress : Not at all  Social Connections: Unknown (10/16/2021)   Received from [facility], [facility]   Social Network    Social Network: Not on file  Intimate Partner Violence: Unknown (09/19/2021)   Received from [facility], [facility]   HITS    Physically Hurt: Not on file    Insult or Talk Down To: Not on file    Threaten Physical Harm: Not on file    Scream or Curse: Not on file   Social History   Tobacco Use  Smoking Status Never  Smokeless Tobacco Never   Social History   Substance and Sexual Activity  Alcohol Use No   Comment: occ/social    Family History  Problem Relation Age of Onset   Ovarian cancer Mother    Breast cancer Paternal Aunt      ROS: Denies fever, fatigue, unexplained weight loss/gain,  hearing or vision changes, cardiac or respiratory complaints. Denies neurological deficits, musculoskeletal complaints, gastrointestinal or genitourinary complaints, mental health complaints, and skin changes.   Objective:   Today's Vitals   05/08/23 0838  BP: 120/70  Pulse: 68  Temp: 98 F (36.7 C)  TempSrc: Temporal  SpO2: 98%  Weight: 171 lb 6.4 oz (77.7 kg)  Height: 5\' 2"  (1.575 m)    GENERAL APPEARANCE: Well-appearing, in NAD. Well nourished.  SKIN: Pink, warm and dry. Turgor normal. No rash, lesion, ulceration, or ecchymoses. Hair evenly distributed.  HEENT: HEAD: Normocephalic.  EYES: PERRLA. EOMI. Lids intact w/o defect. Sclera white, Conjunctiva pink w/o exudate.  EARS: External ear w/o redness, swelling, masses or lesions. EAC clear. TM's intact, translucent w/o bulging, appropriate landmarks visualized. Appropriate acuity to conversational tones.  NOSE: Septum midline w/o deformity. Nares patent, mucosa pink and non-inflamed w/o drainage. No sinus tenderness.  THROAT: Uvula midline. Oropharynx clear. Tonsils non-inflamed w/o exudate . Oral mucosa pink and moist.  NECK: Supple, Trachea midline. Full ROM w/o pain or tenderness. No lymphadenopathy. Thyroid non-tender w/o enlargement or palpable masses.  RESPIRATORY: Chest wall symmetrical w/o masses. Respirations even and non-labored. Breath sounds clear to auscultation bilaterally. No wheezes, rales, rhonchi, or  crackles. CARDIAC: S1, S2 present, regular rate and rhythm. No gallops, murmurs, rubs, or clicks. Capillary refill <2 seconds. Peripheral pulses 2+ bilaterally. GI: Abdomen soft w/o distention. Normoactive bowel sounds. No palpable masses or tenderness. No guarding or rebound tenderness. Liver and spleen w/o tenderness or enlargement. No CVA tenderness.  MSK: Muscle tone and strength appropriate for age, w/o atrophy or abnormal movement.  EXTREMITIES: Active ROM intact, w/o tenderness, crepitus, or contracture. No obvious joint deformities or effusions. No clubbing, edema, or cyanosis.  NEUROLOGIC: CN's II-XII intact. Motor strength symmetrical with no obvious weakness. No sensory deficits. Steady, even gait.  PSYCH/MENTAL STATUS: Alert, oriented x 3. Cooperative, appropriate mood and affect.    Results for orders placed or performed in visit on 05/08/23  HM PAP SMEAR  Result Value Ref Range   HM Pap smear see report in chart     Assessment & Plan:  Encounter for general adult medical examination without abnormal findings -     CBC with Differential/Platelet -     Comprehensive metabolic panel -     TSH -     Lipid panel  Colon cancer screening -     Ambulatory referral to Gastroenterology  Immunization due -     Flu vaccine trivalent PF, 6mos and older(Flulaval,Afluria,Fluarix,Fluzone)    Orders Placed This Encounter  Procedures   Flu vaccine trivalent PF, 6mos and older(Flulaval,Afluria,Fluarix,Fluzone)   HM PAP SMEAR    This external order was created through the Results Console.   CBC with Differential/Platelet   Comprehensive metabolic panel   TSH   Lipid panel   Ambulatory referral to Gastroenterology    Referral Priority:   Routine    Referral Type:   Consultation    Referral Reason:   Specialty Services Required    Number of Visits Requested:   1    PATIENT COUNSELING:  - Encouraged a healthy well-balanced diet. Patient may adjust caloric intake to maintain or  achieve ideal body weight. May reduce intake of dietary saturated fat and total fat and have adequate dietary potassium and calcium preferably from fresh fruits, vegetables, and low-fat dairy products.   - Advised to avoid cigarette smoking. - Discussed with the patient that most people either abstain from alcohol or drink  within safe limits (<=14/week and <=4 drinks/occasion for males, <=7/weeks and <= 3 drinks/occasion for females) and that the risk for alcohol disorders and other health effects rises proportionally with the number of drinks per week and how often a drinker exceeds daily limits. - Discussed cessation/primary prevention of drug use and availability of treatment for abuse.  - Discussed sexually transmitted diseases, avoidance of unintended pregnancy and contraceptive alternatives.  - Stressed the importance of regular exercise - Injury prevention: Discussed safety belts, safety helmets, smoke detector, smoking near bedding or upholstery.  - Dental health: Discussed importance of regular tooth brushing, flossing, and dental visits.   NEXT PREVENTATIVE PHYSICAL DUE IN 1 YEAR.  Return in about 1 year (around 05/07/2024) for Fasting Annual Physical Exam and as needed.  Salvatore Decent, FNP

## 2023-05-13 ENCOUNTER — Encounter: Payer: Self-pay | Admitting: Physical Therapy

## 2023-05-13 ENCOUNTER — Other Ambulatory Visit: Payer: Self-pay | Admitting: Internal Medicine

## 2023-05-13 DIAGNOSIS — E782 Mixed hyperlipidemia: Secondary | ICD-10-CM

## 2023-05-13 MED ORDER — ATORVASTATIN CALCIUM 20 MG PO TABS: 20.0000 mg | ORAL_TABLET | Freq: Every day | ORAL | 1 refills | Status: AC

## 2023-12-30 ENCOUNTER — Ambulatory Visit (INDEPENDENT_AMBULATORY_CARE_PROVIDER_SITE_OTHER): Payer: Self-pay | Admitting: Internal Medicine

## 2023-12-30 ENCOUNTER — Encounter: Payer: Self-pay | Admitting: Internal Medicine

## 2023-12-30 VITALS — BP 104/68 | HR 66 | Temp 98.6°F | Ht 62.0 in | Wt 169.4 lb

## 2023-12-30 DIAGNOSIS — M6289 Other specified disorders of muscle: Secondary | ICD-10-CM

## 2023-12-30 LAB — POC URINALSYSI DIPSTICK (AUTOMATED)
Bilirubin, UA: NEGATIVE
Blood, UA: POSITIVE
Glucose, UA: NEGATIVE
Ketones, UA: NEGATIVE
Leukocytes, UA: NEGATIVE
Nitrite, UA: NEGATIVE
Protein, UA: NEGATIVE
Spec Grav, UA: 1.01 (ref 1.010–1.025)
Urobilinogen, UA: 0.2 U/dL
pH, UA: 7 (ref 5.0–8.0)

## 2023-12-30 MED ORDER — CYCLOBENZAPRINE HCL 5 MG PO TABS: 5.0000 mg | ORAL_TABLET | Freq: Three times a day (TID) | ORAL | 1 refills | Status: AC | PRN

## 2023-12-30 MED ORDER — NAPROXEN 500 MG PO TABS: 500.0000 mg | ORAL_TABLET | Freq: Two times a day (BID) | ORAL | 0 refills | Status: AC

## 2023-12-30 NOTE — Progress Notes (Signed)
 [facility] [address] [place] [address] Dept: [phone] Dept Fax: [phone]  Acute Care Office Visit  Subjective:   Alexandria Casey [birth year] 12/30/2023  Chief Complaint  Patient presents with   Abdominal Pain    Right side, effects daily living, feels inflamed and moves to back  Worse on period    Due to language barrier, a medical interpreter was present during the HPI, ROS, and discussion for the plan of care.  Interpreter: Aida # [id number]   HPI:  Discussed the use of AI scribe software for clinical note transcription with the patient, who gave verbal consent to proceed.  History of Present Illness   Alexandria Casey is a 48 year old female who presents with chronic pelvic pain and urinary symptoms.  She experiences chronic pelvic pain that becomes severe and feels inflamed, radiating upwards and causing difficulty walking. The pain is exacerbated during her menstrual period, with a sensation of something being 'about to burst' in the front area. The pain has been ongoing for several years and has worsened recently, requiring medication every six to seven hours. She has tried muscle relaxants, patches, creams, and pain medications with limited relief.  An ultrasound last year showed no abnormalities, and an x-ray revealed enlargement of discs L4 to L5 and L5 to S1. She underwent pelvic floor therapy for three months, attending twice a week, which provided some relief, but the pain returned, and she struggles to perform home exercises due to pain.  She experiences urinary symptoms when the pain and inflammation are severe, including an inability to hold urine and a slow flow, requiring her to sit slowly due to the sensation of internal pressure. There is no blood in her urine outside of her menstrual period, and she does not experience urinary leakage.  Current medications include ibuprofen   and arthritis creams. She previously used muscle relaxants, which provided minimal relief. No urinary leakage is reported, and urine color is normal.        The following portions of the patient's history were reviewed and updated as appropriate: past medical history, past surgical history, family history, social history, allergies, medications, and problem list.   Patient Active Problem List   Diagnosis Date Noted   Presbyopia of both eyes 12/29/2021   Stationary peripheral pterygium of right eye 12/29/2021   History of pterygium excision 09/17/2021   Peripheral pterygium, progressive, left 09/11/2021   Pterygium of left eye 02/25/2015   Allergic conjunctivitis 11/11/2014   Constipation 02/23/2014   Multiple nevi 01/29/2013   Rash of face 09/08/2012   Umbilical hernia 09/08/2012   Pelvic pain in female 04/17/2012   Fatigue 01/23/2011   GERD 07/06/2010   Past Medical History:  Diagnosis Date   ABNORMAL MATERNAL GLUCOSE TOLERANCE ANTEPARTUM 10/26/2009   Qualifier: Diagnosis of  By: [place], Bonnie     IUD (intrauterine device) in place 08/26/10   Low blood pressure    Past Surgical History:  Procedure Laterality Date   BREAST LUMPECTOMY WITH RADIOACTIVE SEED LOCALIZATION Right 09/07/2020   Procedure: RIGHT BREAST LUMPECTOMY WITH RADIOACTIVE SEED LOCALIZATION;  Surgeon: Vanderbilt Ned, MD;  Location: [facility];  Service: General;  Laterality: Right;   Family History  Problem Relation Age of Onset   Ovarian cancer Mother    Breast cancer Paternal Aunt     Current Outpatient Medications:    acetaminophen  (TYLENOL ) 500 MG tablet, Take 500 mg by mouth every 6 (six) hours as  needed., Disp: , Rfl:    cyclobenzaprine  (FLEXERIL ) 5 MG tablet, Take 1 tablet (5 mg total) by mouth 3 (three) times daily as needed for muscle spasms., Disp: 30 tablet, Rfl: 1   naproxen  (NAPROSYN ) 500 MG tablet, Take 1 tablet (500 mg total) by mouth 2 (two) times daily with a meal for 5 days.,  Disp: 10 tablet, Rfl: 0   atorvastatin  (LIPITOR) 20 MG tablet, Take 1 tablet (20 mg total) by mouth daily. (Patient not taking: Reported on 12/30/2023), Disp: 90 tablet, Rfl: 1   drospirenone -ethinyl estradiol  (YAZ) 3-0.02 MG tablet, Take 1 tablet by mouth daily. (Patient not taking: Reported on 12/30/2023), Disp: 28 tablet, Rfl: 11 Allergies  Allergen Reactions   Doxycycline Hives     ROS: A complete ROS was performed with pertinent positives/negatives noted in the HPI. The remainder of the ROS are negative.    Objective:   Today's Vitals   12/30/23 1045  BP: 104/68  Pulse: 66  Temp: 98.6 F (37 C)  TempSrc: Temporal  SpO2: 99%  Weight: 169 lb 6.4 oz (76.8 kg)  Height: 5' 2 (1.575 m)    GENERAL: Well-appearing, in NAD. Well nourished.  SKIN: Pink, warm and dry. No rash, lesion, ulceration, or ecchymoses.  RESPIRATORY:  Respirations even and non-labored. CARDIAC:  Peripheral pulses 2+ bilaterally.  GI: Abdomen soft, TTP to diffuse right lower quadrant and pelvic region. No rebound tenderness. No hepatomegaly or splenomegaly. No CVA tenderness.  MSK: Muscle tone and strength appropriate for age. Joints w/o tenderness, redness, or swelling. EXTREMITIES: Without clubbing, cyanosis, or edema.  NEUROLOGIC: No motor or sensory deficits. Steady, even gait.  PSYCH/MENTAL STATUS: Alert, oriented x 3. Cooperative, appropriate mood and affect.    Results for orders placed or performed in visit on 12/30/23  POCT Urinalysis Dipstick (Automated)  Result Value Ref Range   Color, UA yellow    Clarity, UA clear    Glucose, UA Negative Negative   Bilirubin, UA neg    Ketones, UA neg    Spec Grav, UA 1.010 1.010 - 1.025   Blood, UA positive    pH, UA 7.0 5.0 - 8.0   Protein, UA Negative Negative   Urobilinogen, UA 0.2 0.2 or 1.0 E.U./dL   Nitrite, UA neg    Leukocytes, UA Negative Negative      Assessment & Plan:  Assessment and Plan    Pelvic Floor Dysfunction Chronic pelvic  pain with urinary urgency and difficulty, exacerbated during menstruation. Previous imaging unremarkable except for disc enlargement at L4-L5 and L5-S1. Some relief from pelvic floor therapy. No infection in urine, blood due to current menstruation. Muscle relaxants may cause drowsiness. - Prescribed Flexeril  5mg  up to three times daily as needed, noting potential drowsiness. - Prescribed naproxen  500mg  twice daily with food for five days to reduce inflammation. - Referred to physical therapy for pelvic floor exercises - patient will contact her prior PT office to set up appointment. - For future pain: Advised taking ibuprofen  two tablets every six to eight hours as needed, starting two days before menstruation - Recommended using a heating pad during menstruation for pain relief. - Considered referral to OB/GYN if pain persists despite current management.      Meds ordered this encounter  Medications   cyclobenzaprine  (FLEXERIL ) 5 MG tablet    Sig: Take 1 tablet (5 mg total) by mouth 3 (three) times daily as needed for muscle spasms.    Dispense:  30 tablet    Refill:  1    Supervising Provider:   THOMPSON, AARON B [[id number]]   naproxen  (NAPROSYN ) 500 MG tablet    Sig: Take 1 tablet (500 mg total) by mouth 2 (two) times daily with a meal for 5 days.    Dispense:  10 tablet    Refill:  0    Supervising Provider:   THOMPSON, AARON B [[id number]]   Orders Placed This Encounter  Procedures   POCT Urinalysis Dipstick (Automated)   Lab Orders         POCT Urinalysis Dipstick (Automated)     No images are attached to the encounter or orders placed in the encounter.  Return if symptoms worsen or fail to improve.   Rosina Senters, FNP

## 2024-01-03 ENCOUNTER — Telehealth: Payer: Self-pay

## 2024-01-03 NOTE — Telephone Encounter (Signed)
 Physical therapy referral is closed Copied from CRM [id number]. Topic: Medical Record Request - Provider/Facility Request >> Jan 03, 2024 10:45 AM Franky GRADE wrote: Reason for CRM: [facility] is calling to request latest office notes with Rosina Senters referring patient to physical therapy. Best call back number [phone] Fax number is [phone].

## 2024-01-31 ENCOUNTER — Encounter: Payer: Self-pay | Admitting: Gastroenterology

## 2024-02-25 ENCOUNTER — Ambulatory Visit (AMBULATORY_SURGERY_CENTER): Payer: Self-pay

## 2024-02-25 VITALS — Ht 62.0 in | Wt 173.0 lb

## 2024-02-25 DIAGNOSIS — Z1211 Encounter for screening for malignant neoplasm of colon: Secondary | ICD-10-CM

## 2024-02-25 MED ORDER — NA SULFATE-K SULFATE-MG SULF 17.5-3.13-1.6 GM/177ML PO SOLN: 1.0000 | Freq: Once | ORAL | 0 refills | Status: AC

## 2024-02-25 NOTE — Progress Notes (Signed)

## 2024-03-10 ENCOUNTER — Ambulatory Visit (AMBULATORY_SURGERY_CENTER): Payer: Self-pay | Admitting: Gastroenterology

## 2024-03-10 ENCOUNTER — Encounter: Payer: Self-pay | Admitting: Gastroenterology

## 2024-03-10 VITALS — BP 118/71 | HR 54 | Temp 98.1°F | Resp 10 | Ht 62.0 in | Wt 173.0 lb

## 2024-03-10 DIAGNOSIS — K648 Other hemorrhoids: Secondary | ICD-10-CM

## 2024-03-10 DIAGNOSIS — Z1211 Encounter for screening for malignant neoplasm of colon: Secondary | ICD-10-CM

## 2024-03-10 MED ORDER — SODIUM CHLORIDE 0.9 % IV SOLN: 500.0000 mL | Freq: Once | INTRAVENOUS | Status: DC

## 2024-03-10 NOTE — Progress Notes (Signed)
 History and Physical:  This patient presents for endoscopic testing for: Encounter Diagnosis  Name Primary?   Special screening for malignant neoplasms, colon Yes    48 year old woman here today for her first screening colonoscopy.  She has recently noticed intermittent episodes of constipation or diarrhea without changes in medicines or diet. Patient is otherwise without complaints or active issues today.   Past Medical History: Past Medical History:  Diagnosis Date   ABNORMAL MATERNAL GLUCOSE TOLERANCE ANTEPARTUM 10/26/2009   Qualifier: Diagnosis of  By: [place], Bonnie     Arthritis    Cataract    IUD (intrauterine device) in place 08/26/2010   Low blood pressure      Past Surgical History: Past Surgical History:  Procedure Laterality Date   BREAST LUMPECTOMY WITH RADIOACTIVE SEED LOCALIZATION Right 09/07/2020   Procedure: RIGHT BREAST LUMPECTOMY WITH RADIOACTIVE SEED LOCALIZATION;  Surgeon: Vanderbilt Ned, MD;  Location: [facility];  Service: General;  Laterality: Right;    Allergies: Allergies  Allergen Reactions   Doxycycline Hives    Outpatient Meds: Current Outpatient Medications  Medication Sig Dispense Refill   acetaminophen  (TYLENOL ) 500 MG tablet Take 500 mg by mouth every 6 (six) hours as needed.     atorvastatin  (LIPITOR) 20 MG tablet Take 1 tablet (20 mg total) by mouth daily. (Patient not taking: No sig reported) 90 tablet 1   cyclobenzaprine  (FLEXERIL ) 5 MG tablet Take 1 tablet (5 mg total) by mouth 3 (three) times daily as needed for muscle spasms. (Patient not taking: Reported on 03/10/2024) 30 tablet 1   drospirenone -ethinyl estradiol  (YAZ) 3-0.02 MG tablet Take 1 tablet by mouth daily. (Patient not taking: Reported on 03/10/2024) 28 tablet 11   estrogens, conjugated, (PREMARIN) 0.3 MG tablet Take 0.3 mg by mouth daily. Take daily for 21 days then do not take for 7 days.     Current Facility-Administered Medications  Medication Dose  Route Frequency Provider Last Rate Last Admin   0.9 %  sodium chloride  infusion  500 mL Intravenous Once Danis, Tessi Eustache L III, MD          ___________________________________________________________________ Objective   Exam:  BP 123/73   Pulse 68   Temp 98.1 F (36.7 C) (Temporal)   Ht 5' 2 (1.575 m)   Wt 173 lb (78.5 kg)   LMP 02/11/2024   SpO2 97%   BMI 31.64 kg/m   CV: regular , S1/S2 Resp: clear to auscultation bilaterally, normal RR and effort noted GI: soft, no tenderness, with active bowel sounds.   Assessment: Encounter Diagnosis  Name Primary?   Special screening for malignant neoplasms, colon Yes     Plan: Colonoscopy   The benefits and risks of the planned procedure(s) were described in detail with the patient or (when appropriate) their health care proxy.  Risks were outlined as including, but not limited to, bleeding, infection, perforation, adverse medication reaction leading to cardiac or pulmonary decompensation, pancreatitis (if ERCP).  The limitation of incomplete mucosal visualization was also discussed.  No guarantees or warranties were given.  The patient is appropriate for an endoscopic procedure in the ambulatory setting.   - Victory Brand, MD

## 2024-03-10 NOTE — Progress Notes (Signed)
 Vss nad trans to pacu

## 2024-03-10 NOTE — Patient Instructions (Addendum)
 USTED TUVO UN PROCEDIMIENTO ENDOSCPICO HOY EN EL [facility]:   Lea el informe del procedimiento que se le entreg para cualquier pregunta especfica sobre lo que se Dentist.  Si el informe del examen no responde a sus preguntas, por favor llame a su gastroenterlogo para aclararlo.  Si usted solicit que no se le den [employer] de lo que se Clinical cytogeneticist en su procedimiento al [employer] va a cuidar, entonces el informe del procedimiento se ha incluido en un sobre sellado para que usted lo revise despus cuando le sea ms conveniente.   LO QUE PUEDE ESPERAR: Algunas sensaciones de hinchazn en el abdomen.  Puede tener ms gases de lo normal.  El caminar puede ayudarle a eliminar el aire que se le puso en el tracto gastrointestinal durante el procedimiento y reducir la hinchazn.  Si le hicieron una endoscopia inferior (como una colonoscopia o una sigmoidoscopia flexible), podra notar manchas de sangre en las heces fecales o en el papel higinico.  Si se someti a una preparacin intestinal para su procedimiento, es posible que no tenga una evacuacin intestinal normal durante [employer].   Tenga en cuenta:  Es posible que note un poco de irritacin y congestin en la nariz o algn drenaje.  Esto es debido al oxgeno [employer] durante su procedimiento.  No hay que preocuparse y esto debe desaparecer ms o Regulatory affairs officer.   SNTOMAS PARA REPORTAR INMEDIATAMENTE:  Despus de una endoscopia inferior (colonoscopia o sigmoidoscopia flexible):  Cantidades excesivas de sangre en las heces fecales  Sensibilidad significativa o empeoramiento de los dolores abdominales   Hinchazn aguda del abdomen que antes no tena   Fiebre de 100F o ms   Para asuntos urgentes o de Associate Professor, puede comunicarse con un gastroenterlogo a cualquier hora llamando al [phone].  DIETA:  Recomendamos una comida pequea al principio, pero luego puede continuar con su dieta normal.  Tome  muchos lquidos, pero debe evitar las bebidas alcohlicas durante 24 horas.    ACTIVIDAD:  Debe planear tomarse las cosas con calma por el resto del da y no debe CONDUCIR ni usar maquinaria pesada Patent examiner (debido a los medicamentos de sedacin utilizados durante el examen).     SEGUIMIENTO: Nuestro personal llamar al nmero que aparece en su historial al siguiente da hbil de su procedimiento para ver cmo se siente y para responder cualquier pregunta o inquietud que pueda tener con respecto a la informacin que se le dio despus del procedimiento. Si no podemos contactarle, le dejaremos un mensaje.  Sin embargo, si se siente bien y no tiene English as a second language teacher, no es necesario que nos devuelva la llamada.  Asumiremos que ha regresado a sus actividades diarias normales sin incidentes. Si se le tomaron algunas biopsias, le contactaremos por telfono o por carta en las prximas 3 semanas.  Si no ha sabido [employer] biopsias en el transcurso de 3 semanas, por favor llmenos al [phone].   FIRMAS/CONFIDENCIALIDAD: Usted y/o el acompaante que le cuide han firmado documentos que se ingresarn en su historial mdico electrnico.  Estas firmas atestiguan el hecho de que la informacin anterior

## 2024-03-10 NOTE — Op Note (Signed)
 [facility] Patient Name: Alexandria Casey Procedure Date: 03/10/2024 8:38 AM MRN: [id number] Endoscopist: Victory L. Legrand , MD, [id number] Age: 48 Referring MD:  Date of Birth: 12-04-1975 Gender: Female Account #: [id number] Procedure:                Colonoscopy Indications:              Screening for colorectal malignant neoplasm, This                            is the patient's first colonoscopy Medicines:                Monitored Anesthesia Care Procedure:                Pre-Anesthesia Assessment:                           - Prior to the procedure, a History and Physical                            was performed, and patient medications and                            allergies were reviewed. The patient's tolerance of                            previous anesthesia was also reviewed. The risks                            and benefits of the procedure and the sedation                            options and risks were discussed with the patient.                            All questions were answered, and informed consent                            was obtained. Prior Anticoagulants: The patient has                            taken no anticoagulant or antiplatelet agents. ASA                            Grade Assessment: II - A patient with mild systemic                            disease. After reviewing the risks and benefits,                            the patient was deemed in satisfactory condition to                            undergo the procedure.  After obtaining informed consent, the colonoscope                            was passed under direct vision. Throughout the                            procedure, the patient's blood pressure, pulse, and                            oxygen saturations were monitored continuously. The                            Olympus Scope SN [id number] was introduced through the                            anus  and advanced to the the cecum, identified by                            appendiceal orifice and ileocecal valve. The                            colonoscopy was performed without difficulty. The                            patient tolerated the procedure well. The quality                            of the bowel preparation was excellent. The                            ileocecal valve, appendiceal orifice, and rectum                            were photographed. The bowel preparation used was                            SUPREP. Scope In: 8:44:00 AM Scope Out: 8:55:20 AM Scope Withdrawal Time: 0 hours 7 minutes 58 seconds  Total Procedure Duration: 0 hours 11 minutes 20 seconds  Findings:                 The perianal and digital rectal examinations were                            normal.                           Repeat examination of right colon under NBI                            performed.                           Internal hemorrhoids were found. The hemorrhoids  were small.                           The exam was otherwise without abnormality on                            direct and retroflexion views. Complications:            No immediate complications. Estimated Blood Loss:     Estimated blood loss: none. Impression:               - Internal hemorrhoids.                           - The examination was otherwise normal on direct                            and retroflexion views.                           - No specimens collected. Recommendation:           - Patient has a contact number available for                            emergencies. The signs and symptoms of potential                            delayed complications were discussed with the                            patient. Return to normal activities tomorrow.                            Written discharge instructions were provided to the                            patient.                            - Resume previous diet.                           - Continue present medications.                           - Repeat colonoscopy in 10 years for screening                            purposes. Jaquel Glassburn L. Legrand, MD 03/10/2024 8:57:53 AM This report has been signed electronically.

## 2024-03-11 ENCOUNTER — Telehealth: Payer: Self-pay

## 2024-03-11 NOTE — Telephone Encounter (Signed)
  Follow up Call-     03/10/2024    7:52 AM  Call back number  Post procedure Call Back phone  # [phone]  Permission to leave phone message Yes     Patient questions:  Do you have a fever, pain , or abdominal swelling? No. Pain Score  0 *  Have you tolerated food without any problems? Yes.    Have you been able to return to your normal activities? Yes.    Do you have any questions about your discharge instructions: Diet   No. Medications  No. Follow up visit  No.  Do you have questions or concerns about your Care? No.  Actions: * If pain score is 4 or above: No action needed, pain <4.

## 2024-06-04 ENCOUNTER — Ambulatory Visit: Payer: Self-pay | Admitting: Internal Medicine

## 2024-06-04 ENCOUNTER — Encounter: Payer: Self-pay | Admitting: Internal Medicine

## 2024-06-04 VITALS — BP 110/72 | HR 61 | Temp 97.8°F | Ht 62.0 in | Wt 168.8 lb

## 2024-06-04 DIAGNOSIS — S0083XA Contusion of other part of head, initial encounter: Secondary | ICD-10-CM

## 2024-06-04 DIAGNOSIS — H578A2 Foreign body sensation, left eye: Secondary | ICD-10-CM

## 2024-06-04 DIAGNOSIS — T1592XA Foreign body on external eye, part unspecified, left eye, initial encounter: Secondary | ICD-10-CM

## 2024-06-04 DIAGNOSIS — H524 Presbyopia: Secondary | ICD-10-CM

## 2024-06-04 MED ORDER — ERYTHROMYCIN 5 MG/GM OP OINT: TOPICAL_OINTMENT | OPHTHALMIC | 0 refills | Status: AC

## 2024-06-04 MED ORDER — PROPARACAINE HCL 0.5 % OP SOLN
2.0000 [drp] | Freq: Once | OPHTHALMIC | Status: AC
  Administered 2024-06-04: 12:00:00 2 [drp] via OPHTHALMIC

## 2024-06-04 NOTE — Progress Notes (Signed)
 [facility] [address] [place] [address] Dept: [phone] Dept Fax: [phone]  Acute Care Office Visit  Subjective:   Alexandria Casey 12/11/1975 06/04/2024  Chief Complaint  Patient presents with   Eye Problem    Started  5 days ago, dry, itching, feel something is in headache.    HPI:  Discussed the use of AI scribe software for clinical note transcription with the patient, who gave verbal consent to proceed.  History of Present Illness   Alexandria Casey is a 48 year old female who presents with eye discomfort and facial bruising following a fall.  She experienced a fall on Saturday morning while attempting to put up decorations at home, falling forward from a chair. This resulted in facial bruising and discomfort, particularly on right side of forehead, bridge of nose, left side of chin. Initially, she felt okay, but later that night she began to notice pain and discomfort in left eye.  Her left eye feels very dry and as if there is a foreign body present. This eye had undergone surgery in left eye for pterygium three years ago. She used Systane eye drops from a pharmacy, which helped alleviate the dryness. However, she still experiences discomfort when looking up or to the left side. No changes in vision such as blurriness or loss of vision. Hx of presbyopia bilaterally. Current eye doctor is in [facility], she would prefer to see one closer in [place].    She denies neck pain. She reports a possible brief loss of consciousness for about two to three minutes following the fall.   She mentions occasional itching on her eyelid and lashes, and sometimes experiences drainage from her eye, particularly in the morning or when applying lotion near the area. No significant crusting or persistent drainage.  She reports 1 episode of epistaxis from the right nostril since the fall.    No  significant pain or discomfort in her rib cage or knee, despite some bruising.       The following portions of the patient's history were reviewed and updated as appropriate: past medical history, past surgical history, family history, social history, allergies, medications, and problem list.   Patient Active Problem List   Diagnosis Date Noted   Presbyopia of both eyes 12/29/2021   Stationary peripheral pterygium of right eye 12/29/2021   History of pterygium excision 09/17/2021   Peripheral pterygium, progressive, left 09/11/2021   Pterygium of left eye 02/25/2015   Allergic conjunctivitis 11/11/2014   Constipation 02/23/2014   Multiple nevi 01/29/2013   Rash of face 09/08/2012   Umbilical hernia 09/08/2012   Pelvic pain in female 04/17/2012   Fatigue 01/23/2011   GERD 07/06/2010   Past Medical History:  Diagnosis Date   ABNORMAL MATERNAL GLUCOSE TOLERANCE ANTEPARTUM 10/26/2009   Qualifier: Diagnosis of  By: Jordan, Bonnie     Arthritis    Cataract    IUD (intrauterine device) in place 08/26/2010   Low blood pressure    Past Surgical History:  Procedure Laterality Date   BREAST LUMPECTOMY WITH RADIOACTIVE SEED LOCALIZATION Right 09/07/2020   Procedure: RIGHT BREAST LUMPECTOMY WITH RADIOACTIVE SEED LOCALIZATION;  Surgeon: Vanderbilt Ned, MD;  Location: [facility];  Service: General;  Laterality: Right;   Family History  Problem Relation Age of Onset   Ovarian cancer Mother    Breast cancer Paternal Aunt    Colon cancer Neg Hx    Colon polyps Neg Hx  Esophageal cancer Neg Hx    Rectal cancer Neg Hx    Stomach cancer Neg Hx    Current Medications[1] Allergies[2]   ROS: A complete ROS was performed with pertinent positives/negatives noted in the HPI. The remainder of the ROS are negative.    Objective:   Today's Vitals   06/04/24 1026  BP: 110/72  Pulse: 61  Temp: 97.8 F (36.6 C)  TempSrc: Temporal  SpO2: 99%  Weight: 168 lb 12.8 oz (76.6  kg)  Height: 5' 2 (1.575 m)    GENERAL: Well-appearing, in NAD. Well nourished.  SKIN: Pink, warm and dry.  Ecchymosis to right forehead, bridge of nose, and left side of chin. HEENT:    HEAD: Normocephalic, non-traumatic.  EYES: Conjunctive pink without exudate. PERRL, EOMI.  EARS: External ear w/o redness, swelling, masses, or lesions. EAC clear. TM's intact, translucent w/o bulging, appropriate landmarks visualized.  NOSE: Septum midline w/o deformity. Nares patent, mucosa pink and non-inflamed w/o drainage. No maxillofacial tenderness. THROAT: Uvula midline. Oropharynx clear. Tonsils non-inflamed w/o exudate. Mucus membranes pink and moist.  No loose or missing teeth or pain. NECK: Trachea midline. Full ROM w/o pain or tenderness. No lymphadenopathy.  Full range of motion of jaw.  No pain or tenderness. RESPIRATORY: Chest wall symmetrical. Respirations even and non-labored. Breath sounds clear to auscultation bilaterally.  No rib cage pain. CARDIAC: S1, S2 present, regular rate and rhythm. Peripheral pulses 2+ bilaterally.  MSK: Muscle tone and strength appropriate for age. Joints w/o tenderness, redness, or swelling. EXTREMITIES: Without clubbing, cyanosis, or edema.  NEUROLOGIC: No motor or sensory deficits. Steady, even gait.  PSYCH/MENTAL STATUS: Alert, oriented x 3. Cooperative, appropriate mood and affect.   PROCEDURE:  Informed consent given to patient.  Patient gave verbal percent for procedure.  Patient was placed in supine position.  2 drops of proparacaine  were administered into left eye.  Fluorescein strip applied to left eye.  Woods lamp used to identify any abnormalities or abrasions.  No abnormalities or abrasions were seen in left eye.  Left eye was then irrigated with 20 cc of sterile normal saline.  Patient reported irritation of left eye had resolved after irrigation and no further pain.  Patient tolerated procedure well.   No results found for any visits on 06/04/24.     Assessment & Plan:  Assessment and Plan    Foreign body of left eye Foreign body sensation in the left eye post-fall. No visible foreign body or corneal abrasion. Symptoms include discomfort and sensation of a foreign body. No vision changes. -Eye was irrigated, removal of foreign body.  Patient reports no further eye pain. - Prescribed erythromycin  ointment, apply QID for 7 days. - Referred to an eye doctor within the [facility] area.  Facial contusion Contusions on right forehead, nose, and left chin post-fall. Bruising present, no significant pain or functional impairment. - Recommended Tylenol  or ibuprofen  for pain as needed.  Presbyopia Presbyopia in both eyes. - Referred to an eye doctor within the [facility] area.      Meds ordered this encounter  Medications   erythromycin  ophthalmic ointment    Sig: Instill ~1cm ribbon into affected eye(s) 4 times a day for 7 days    Dispense:  3.5 g    Refill:  0    Supervising Provider:   THOMPSON, AARON B [[id number]]   proparacaine  (ALCAINE ) 0.5 % ophthalmic solution 2 drop   Orders Placed This Encounter  Procedures   Ambulatory referral  to Ophthalmology    Referral Priority:   Routine    Referral Type:   Consultation    Referral Reason:   Specialty Services Required    Referred to Provider:   Rockney Longs, MD    Number of Visits Requested:   1   Lab Orders  No laboratory test(s) ordered today   No images are attached to the encounter or orders placed in the encounter.  Return for Scheduled Routine Office Visits and as needed.   Rosina Senters, FNP    [1]  Current Outpatient Medications:    acetaminophen  (TYLENOL ) 500 MG tablet, Take 500 mg by mouth every 6 (six) hours as needed., Disp: , Rfl:    erythromycin  ophthalmic ointment, Instill ~1cm ribbon into affected eye(s) 4 times a day for 7 days, Disp: 3.5 g, Rfl: 0   atorvastatin  (LIPITOR) 20 MG tablet, Take 1 tablet (20 mg total) by mouth daily.  (Patient not taking: No sig reported), Disp: 90 tablet, Rfl: 1   cyclobenzaprine  (FLEXERIL ) 5 MG tablet, Take 1 tablet (5 mg total) by mouth 3 (three) times daily as needed for muscle spasms. (Patient not taking: Reported on 03/10/2024), Disp: 30 tablet, Rfl: 1   drospirenone -ethinyl estradiol  (YAZ) 3-0.02 MG tablet, Take 1 tablet by mouth daily. (Patient not taking: Reported on 03/10/2024), Disp: 28 tablet, Rfl: 11   estrogens, conjugated, (PREMARIN) 0.3 MG tablet, Take 0.3 mg by mouth daily. Take daily for 21 days then do not take for 7 days. (Patient not taking: Reported on 06/04/2024), Disp: , Rfl:   Current Facility-Administered Medications:    proparacaine  (ALCAINE ) 0.5 % ophthalmic solution 2 drop, 2 drop, Left Eye, Once,  [2]  Allergies Allergen Reactions   Doxycycline Hives

## 2024-07-14 ENCOUNTER — Ambulatory Visit: Payer: Self-pay | Admitting: Internal Medicine

## 2024-07-21 ENCOUNTER — Encounter: Payer: Self-pay | Admitting: Internal Medicine

## 2024-08-24 ENCOUNTER — Encounter: Payer: Self-pay | Admitting: Internal Medicine
